# Patient Record
Sex: Female | Born: 1950
Health system: Southern US, Community
[De-identification: ages and names within clinical notes are randomized; demographics above are authoritative.]

## PROBLEM LIST (undated history)

## (undated) DIAGNOSIS — G4730 Sleep apnea, unspecified: Secondary | ICD-10-CM

## (undated) DIAGNOSIS — R112 Nausea with vomiting, unspecified: Secondary | ICD-10-CM

## (undated) DIAGNOSIS — F329 Major depressive disorder, single episode, unspecified: Secondary | ICD-10-CM

## (undated) DIAGNOSIS — Q249 Congenital malformation of heart, unspecified: Secondary | ICD-10-CM

## (undated) DIAGNOSIS — F32A Depression, unspecified: Secondary | ICD-10-CM

## (undated) DIAGNOSIS — I10 Essential (primary) hypertension: Secondary | ICD-10-CM

## (undated) DIAGNOSIS — N189 Chronic kidney disease, unspecified: Secondary | ICD-10-CM

## (undated) DIAGNOSIS — M1990 Unspecified osteoarthritis, unspecified site: Secondary | ICD-10-CM

## (undated) DIAGNOSIS — Z9889 Other specified postprocedural states: Secondary | ICD-10-CM

## (undated) DIAGNOSIS — E785 Hyperlipidemia, unspecified: Secondary | ICD-10-CM

## (undated) HISTORY — PX: TUMOR REMOVAL: SHX12

## (undated) HISTORY — DX: Congenital malformation of heart, unspecified: Q24.9

## (undated) HISTORY — DX: Chronic kidney disease, unspecified: N18.9

## (undated) HISTORY — DX: Other specified postprocedural states: Z98.890

## (undated) HISTORY — DX: Nausea with vomiting, unspecified: R11.2

## (undated) HISTORY — DX: Sleep apnea, unspecified: G47.30

## (undated) HISTORY — PX: KNEE ARTHROSCOPY: SUR90

## (undated) HISTORY — DX: Depression, unspecified: F32.A

## (undated) HISTORY — PX: BREAST REDUCTION SURGERY: SHX8

---
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
## 1997-01-18 HISTORY — PX: ABDOMINAL HYSTERECTOMY: SHX81

## 2004-03-27 ENCOUNTER — Encounter: Admission: RE | Admit: 2004-03-27 | Discharge: 2004-03-27 | Payer: Self-pay | Admitting: Unknown Physician Specialty

## 2004-06-19 ENCOUNTER — Encounter: Admission: RE | Admit: 2004-06-19 | Discharge: 2004-06-19 | Payer: Self-pay | Admitting: Unknown Physician Specialty

## 2004-06-19 ENCOUNTER — Encounter (INDEPENDENT_AMBULATORY_CARE_PROVIDER_SITE_OTHER): Payer: Self-pay | Admitting: *Deleted

## 2005-08-17 ENCOUNTER — Encounter: Admission: RE | Admit: 2005-08-17 | Discharge: 2005-08-17 | Payer: Self-pay | Admitting: Family Medicine

## 2006-09-08 ENCOUNTER — Encounter: Admission: RE | Admit: 2006-09-08 | Discharge: 2006-09-08 | Payer: Self-pay | Admitting: Family Medicine

## 2007-09-11 ENCOUNTER — Encounter: Admission: RE | Admit: 2007-09-11 | Discharge: 2007-09-11 | Payer: Self-pay | Admitting: Family Medicine

## 2008-09-16 ENCOUNTER — Encounter: Admission: RE | Admit: 2008-09-16 | Discharge: 2008-09-16 | Payer: Self-pay | Admitting: Family Medicine

## 2008-11-07 ENCOUNTER — Ambulatory Visit (HOSPITAL_BASED_OUTPATIENT_CLINIC_OR_DEPARTMENT_OTHER): Admission: RE | Admit: 2008-11-07 | Discharge: 2008-11-07 | Payer: Self-pay | Admitting: Plastic Surgery

## 2008-11-07 ENCOUNTER — Encounter (INDEPENDENT_AMBULATORY_CARE_PROVIDER_SITE_OTHER): Payer: Self-pay | Admitting: Plastic Surgery

## 2010-04-23 LAB — POCT HEMOGLOBIN-HEMACUE: Hemoglobin: 14.2 g/dL (ref 12.0–15.0)

## 2010-04-23 LAB — POCT I-STAT, CHEM 8
BUN: 16 mg/dL (ref 6–23)
Calcium, Ion: 1.12 mmol/L (ref 1.12–1.32)
Creatinine, Ser: 0.8 mg/dL (ref 0.4–1.2)
Glucose, Bld: 98 mg/dL (ref 70–99)
Hemoglobin: 14.3 g/dL (ref 12.0–15.0)
Sodium: 142 mEq/L (ref 135–145)
TCO2: 28 mmol/L (ref 0–100)

## 2010-04-23 LAB — BASIC METABOLIC PANEL
BUN: 15 mg/dL (ref 6–23)
Calcium: 9.4 mg/dL (ref 8.4–10.5)
Chloride: 105 mEq/L (ref 96–112)
GFR calc non Af Amer: >60 mL/min (ref >60)
Sodium: 141 mEq/L (ref 135–145)

## 2016-08-26 DIAGNOSIS — R7303 Prediabetes: Secondary | ICD-10-CM | POA: Diagnosis not present

## 2016-08-26 DIAGNOSIS — E782 Mixed hyperlipidemia: Secondary | ICD-10-CM | POA: Diagnosis not present

## 2016-08-26 DIAGNOSIS — I129 Hypertensive chronic kidney disease with stage 1 through stage 4 chronic kidney disease, or unspecified chronic kidney disease: Secondary | ICD-10-CM | POA: Diagnosis not present

## 2016-09-02 DIAGNOSIS — H524 Presbyopia: Secondary | ICD-10-CM | POA: Diagnosis not present

## 2016-09-02 DIAGNOSIS — Z139 Encounter for screening, unspecified: Secondary | ICD-10-CM | POA: Diagnosis not present

## 2016-09-02 DIAGNOSIS — N182 Chronic kidney disease, stage 2 (mild): Secondary | ICD-10-CM | POA: Diagnosis not present

## 2016-09-02 DIAGNOSIS — I129 Hypertensive chronic kidney disease with stage 1 through stage 4 chronic kidney disease, or unspecified chronic kidney disease: Secondary | ICD-10-CM | POA: Diagnosis not present

## 2016-09-02 DIAGNOSIS — H25013 Cortical age-related cataract, bilateral: Secondary | ICD-10-CM | POA: Diagnosis not present

## 2016-09-02 DIAGNOSIS — Z6833 Body mass index (BMI) 33.0-33.9, adult: Secondary | ICD-10-CM | POA: Diagnosis not present

## 2016-09-02 DIAGNOSIS — E782 Mixed hyperlipidemia: Secondary | ICD-10-CM | POA: Diagnosis not present

## 2016-09-08 DIAGNOSIS — G4733 Obstructive sleep apnea (adult) (pediatric): Secondary | ICD-10-CM | POA: Diagnosis not present

## 2016-09-08 DIAGNOSIS — R0902 Hypoxemia: Secondary | ICD-10-CM | POA: Diagnosis not present

## 2016-09-08 DIAGNOSIS — R5383 Other fatigue: Secondary | ICD-10-CM | POA: Diagnosis not present

## 2016-10-15 DIAGNOSIS — G4733 Obstructive sleep apnea (adult) (pediatric): Secondary | ICD-10-CM | POA: Diagnosis not present

## 2016-11-01 DIAGNOSIS — J0100 Acute maxillary sinusitis, unspecified: Secondary | ICD-10-CM | POA: Diagnosis not present

## 2016-11-14 DIAGNOSIS — G4733 Obstructive sleep apnea (adult) (pediatric): Secondary | ICD-10-CM | POA: Diagnosis not present

## 2016-11-23 DIAGNOSIS — G4733 Obstructive sleep apnea (adult) (pediatric): Secondary | ICD-10-CM | POA: Diagnosis not present

## 2016-12-14 DIAGNOSIS — M9903 Segmental and somatic dysfunction of lumbar region: Secondary | ICD-10-CM | POA: Diagnosis not present

## 2016-12-14 DIAGNOSIS — M62830 Muscle spasm of back: Secondary | ICD-10-CM | POA: Diagnosis not present

## 2016-12-14 DIAGNOSIS — M5416 Radiculopathy, lumbar region: Secondary | ICD-10-CM | POA: Diagnosis not present

## 2016-12-14 DIAGNOSIS — M9901 Segmental and somatic dysfunction of cervical region: Secondary | ICD-10-CM | POA: Diagnosis not present

## 2016-12-14 DIAGNOSIS — M9902 Segmental and somatic dysfunction of thoracic region: Secondary | ICD-10-CM | POA: Diagnosis not present

## 2016-12-14 DIAGNOSIS — M9905 Segmental and somatic dysfunction of pelvic region: Secondary | ICD-10-CM | POA: Diagnosis not present

## 2016-12-14 DIAGNOSIS — M542 Cervicalgia: Secondary | ICD-10-CM | POA: Diagnosis not present

## 2016-12-15 DIAGNOSIS — G4733 Obstructive sleep apnea (adult) (pediatric): Secondary | ICD-10-CM | POA: Diagnosis not present

## 2016-12-16 DIAGNOSIS — M9905 Segmental and somatic dysfunction of pelvic region: Secondary | ICD-10-CM | POA: Diagnosis not present

## 2016-12-16 DIAGNOSIS — M9901 Segmental and somatic dysfunction of cervical region: Secondary | ICD-10-CM | POA: Diagnosis not present

## 2016-12-16 DIAGNOSIS — M62830 Muscle spasm of back: Secondary | ICD-10-CM | POA: Diagnosis not present

## 2016-12-16 DIAGNOSIS — M5416 Radiculopathy, lumbar region: Secondary | ICD-10-CM | POA: Diagnosis not present

## 2016-12-16 DIAGNOSIS — M542 Cervicalgia: Secondary | ICD-10-CM | POA: Diagnosis not present

## 2016-12-16 DIAGNOSIS — M9902 Segmental and somatic dysfunction of thoracic region: Secondary | ICD-10-CM | POA: Diagnosis not present

## 2016-12-16 DIAGNOSIS — M9903 Segmental and somatic dysfunction of lumbar region: Secondary | ICD-10-CM | POA: Diagnosis not present

## 2016-12-21 DIAGNOSIS — M9901 Segmental and somatic dysfunction of cervical region: Secondary | ICD-10-CM | POA: Diagnosis not present

## 2016-12-21 DIAGNOSIS — M62830 Muscle spasm of back: Secondary | ICD-10-CM | POA: Diagnosis not present

## 2016-12-21 DIAGNOSIS — M542 Cervicalgia: Secondary | ICD-10-CM | POA: Diagnosis not present

## 2016-12-21 DIAGNOSIS — M9905 Segmental and somatic dysfunction of pelvic region: Secondary | ICD-10-CM | POA: Diagnosis not present

## 2016-12-21 DIAGNOSIS — M9903 Segmental and somatic dysfunction of lumbar region: Secondary | ICD-10-CM | POA: Diagnosis not present

## 2016-12-21 DIAGNOSIS — M9902 Segmental and somatic dysfunction of thoracic region: Secondary | ICD-10-CM | POA: Diagnosis not present

## 2016-12-21 DIAGNOSIS — M5416 Radiculopathy, lumbar region: Secondary | ICD-10-CM | POA: Diagnosis not present

## 2016-12-23 DIAGNOSIS — M5416 Radiculopathy, lumbar region: Secondary | ICD-10-CM | POA: Diagnosis not present

## 2016-12-23 DIAGNOSIS — M542 Cervicalgia: Secondary | ICD-10-CM | POA: Diagnosis not present

## 2016-12-23 DIAGNOSIS — M62830 Muscle spasm of back: Secondary | ICD-10-CM | POA: Diagnosis not present

## 2016-12-23 DIAGNOSIS — M9903 Segmental and somatic dysfunction of lumbar region: Secondary | ICD-10-CM | POA: Diagnosis not present

## 2016-12-23 DIAGNOSIS — M9901 Segmental and somatic dysfunction of cervical region: Secondary | ICD-10-CM | POA: Diagnosis not present

## 2016-12-23 DIAGNOSIS — M9902 Segmental and somatic dysfunction of thoracic region: Secondary | ICD-10-CM | POA: Diagnosis not present

## 2016-12-23 DIAGNOSIS — M9905 Segmental and somatic dysfunction of pelvic region: Secondary | ICD-10-CM | POA: Diagnosis not present

## 2016-12-24 DIAGNOSIS — I129 Hypertensive chronic kidney disease with stage 1 through stage 4 chronic kidney disease, or unspecified chronic kidney disease: Secondary | ICD-10-CM | POA: Diagnosis not present

## 2016-12-24 DIAGNOSIS — R7303 Prediabetes: Secondary | ICD-10-CM | POA: Diagnosis not present

## 2016-12-24 DIAGNOSIS — E782 Mixed hyperlipidemia: Secondary | ICD-10-CM | POA: Diagnosis not present

## 2016-12-28 DIAGNOSIS — M62830 Muscle spasm of back: Secondary | ICD-10-CM | POA: Diagnosis not present

## 2016-12-28 DIAGNOSIS — M9902 Segmental and somatic dysfunction of thoracic region: Secondary | ICD-10-CM | POA: Diagnosis not present

## 2016-12-28 DIAGNOSIS — M9903 Segmental and somatic dysfunction of lumbar region: Secondary | ICD-10-CM | POA: Diagnosis not present

## 2016-12-28 DIAGNOSIS — M9901 Segmental and somatic dysfunction of cervical region: Secondary | ICD-10-CM | POA: Diagnosis not present

## 2016-12-28 DIAGNOSIS — M542 Cervicalgia: Secondary | ICD-10-CM | POA: Diagnosis not present

## 2016-12-28 DIAGNOSIS — M5416 Radiculopathy, lumbar region: Secondary | ICD-10-CM | POA: Diagnosis not present

## 2016-12-28 DIAGNOSIS — M9905 Segmental and somatic dysfunction of pelvic region: Secondary | ICD-10-CM | POA: Diagnosis not present

## 2017-01-04 DIAGNOSIS — M5416 Radiculopathy, lumbar region: Secondary | ICD-10-CM | POA: Diagnosis not present

## 2017-01-04 DIAGNOSIS — M9902 Segmental and somatic dysfunction of thoracic region: Secondary | ICD-10-CM | POA: Diagnosis not present

## 2017-01-04 DIAGNOSIS — M9905 Segmental and somatic dysfunction of pelvic region: Secondary | ICD-10-CM | POA: Diagnosis not present

## 2017-01-04 DIAGNOSIS — M9901 Segmental and somatic dysfunction of cervical region: Secondary | ICD-10-CM | POA: Diagnosis not present

## 2017-01-04 DIAGNOSIS — M9903 Segmental and somatic dysfunction of lumbar region: Secondary | ICD-10-CM | POA: Diagnosis not present

## 2017-01-04 DIAGNOSIS — M62830 Muscle spasm of back: Secondary | ICD-10-CM | POA: Diagnosis not present

## 2017-01-04 DIAGNOSIS — M542 Cervicalgia: Secondary | ICD-10-CM | POA: Diagnosis not present

## 2017-01-14 DIAGNOSIS — G4733 Obstructive sleep apnea (adult) (pediatric): Secondary | ICD-10-CM | POA: Diagnosis not present

## 2017-01-24 DIAGNOSIS — Z780 Asymptomatic menopausal state: Secondary | ICD-10-CM | POA: Diagnosis not present

## 2017-01-24 DIAGNOSIS — Z13820 Encounter for screening for osteoporosis: Secondary | ICD-10-CM | POA: Diagnosis not present

## 2017-02-14 DIAGNOSIS — G4733 Obstructive sleep apnea (adult) (pediatric): Secondary | ICD-10-CM | POA: Diagnosis not present

## 2017-02-21 DIAGNOSIS — E669 Obesity, unspecified: Secondary | ICD-10-CM | POA: Diagnosis not present

## 2017-02-28 DIAGNOSIS — E669 Obesity, unspecified: Secondary | ICD-10-CM | POA: Diagnosis not present

## 2017-03-07 DIAGNOSIS — Z6835 Body mass index (BMI) 35.0-35.9, adult: Secondary | ICD-10-CM | POA: Diagnosis not present

## 2017-03-07 DIAGNOSIS — E669 Obesity, unspecified: Secondary | ICD-10-CM | POA: Diagnosis not present

## 2017-03-14 DIAGNOSIS — Z6835 Body mass index (BMI) 35.0-35.9, adult: Secondary | ICD-10-CM | POA: Diagnosis not present

## 2017-03-17 DIAGNOSIS — G4733 Obstructive sleep apnea (adult) (pediatric): Secondary | ICD-10-CM | POA: Diagnosis not present

## 2017-03-28 DIAGNOSIS — Z6835 Body mass index (BMI) 35.0-35.9, adult: Secondary | ICD-10-CM | POA: Diagnosis not present

## 2017-04-07 DIAGNOSIS — E782 Mixed hyperlipidemia: Secondary | ICD-10-CM | POA: Diagnosis not present

## 2017-04-07 DIAGNOSIS — R7303 Prediabetes: Secondary | ICD-10-CM | POA: Diagnosis not present

## 2017-04-07 DIAGNOSIS — I129 Hypertensive chronic kidney disease with stage 1 through stage 4 chronic kidney disease, or unspecified chronic kidney disease: Secondary | ICD-10-CM | POA: Diagnosis not present

## 2017-04-11 DIAGNOSIS — Z6835 Body mass index (BMI) 35.0-35.9, adult: Secondary | ICD-10-CM | POA: Diagnosis not present

## 2017-04-14 DIAGNOSIS — R7303 Prediabetes: Secondary | ICD-10-CM | POA: Diagnosis not present

## 2017-04-14 DIAGNOSIS — Z1331 Encounter for screening for depression: Secondary | ICD-10-CM | POA: Diagnosis not present

## 2017-04-14 DIAGNOSIS — Z9181 History of falling: Secondary | ICD-10-CM | POA: Diagnosis not present

## 2017-04-14 DIAGNOSIS — G4733 Obstructive sleep apnea (adult) (pediatric): Secondary | ICD-10-CM | POA: Diagnosis not present

## 2017-04-14 DIAGNOSIS — I129 Hypertensive chronic kidney disease with stage 1 through stage 4 chronic kidney disease, or unspecified chronic kidney disease: Secondary | ICD-10-CM | POA: Diagnosis not present

## 2017-04-14 DIAGNOSIS — E782 Mixed hyperlipidemia: Secondary | ICD-10-CM | POA: Diagnosis not present

## 2017-04-14 DIAGNOSIS — Z0000 Encounter for general adult medical examination without abnormal findings: Secondary | ICD-10-CM | POA: Diagnosis not present

## 2017-04-14 DIAGNOSIS — N182 Chronic kidney disease, stage 2 (mild): Secondary | ICD-10-CM | POA: Diagnosis not present

## 2017-04-14 DIAGNOSIS — Z139 Encounter for screening, unspecified: Secondary | ICD-10-CM | POA: Diagnosis not present

## 2017-04-18 DIAGNOSIS — Z7189 Other specified counseling: Secondary | ICD-10-CM | POA: Diagnosis not present

## 2017-04-18 DIAGNOSIS — R7303 Prediabetes: Secondary | ICD-10-CM | POA: Diagnosis not present

## 2017-04-18 DIAGNOSIS — Z6834 Body mass index (BMI) 34.0-34.9, adult: Secondary | ICD-10-CM | POA: Diagnosis not present

## 2017-05-02 DIAGNOSIS — E782 Mixed hyperlipidemia: Secondary | ICD-10-CM | POA: Diagnosis not present

## 2017-05-02 DIAGNOSIS — R7303 Prediabetes: Secondary | ICD-10-CM | POA: Diagnosis not present

## 2017-05-02 DIAGNOSIS — Z6834 Body mass index (BMI) 34.0-34.9, adult: Secondary | ICD-10-CM | POA: Diagnosis not present

## 2017-05-15 DIAGNOSIS — G4733 Obstructive sleep apnea (adult) (pediatric): Secondary | ICD-10-CM | POA: Diagnosis not present

## 2017-05-16 DIAGNOSIS — R7303 Prediabetes: Secondary | ICD-10-CM | POA: Diagnosis not present

## 2017-05-16 DIAGNOSIS — Z6834 Body mass index (BMI) 34.0-34.9, adult: Secondary | ICD-10-CM | POA: Diagnosis not present

## 2017-05-16 DIAGNOSIS — E782 Mixed hyperlipidemia: Secondary | ICD-10-CM | POA: Diagnosis not present

## 2017-05-30 DIAGNOSIS — Z6834 Body mass index (BMI) 34.0-34.9, adult: Secondary | ICD-10-CM | POA: Diagnosis not present

## 2017-05-30 DIAGNOSIS — R7303 Prediabetes: Secondary | ICD-10-CM | POA: Diagnosis not present

## 2017-05-30 DIAGNOSIS — E782 Mixed hyperlipidemia: Secondary | ICD-10-CM | POA: Diagnosis not present

## 2017-06-14 DIAGNOSIS — G4733 Obstructive sleep apnea (adult) (pediatric): Secondary | ICD-10-CM | POA: Diagnosis not present

## 2017-06-27 DIAGNOSIS — E782 Mixed hyperlipidemia: Secondary | ICD-10-CM | POA: Diagnosis not present

## 2017-06-27 DIAGNOSIS — R7303 Prediabetes: Secondary | ICD-10-CM | POA: Diagnosis not present

## 2017-06-27 DIAGNOSIS — Z6834 Body mass index (BMI) 34.0-34.9, adult: Secondary | ICD-10-CM | POA: Diagnosis not present

## 2017-07-11 DIAGNOSIS — R7303 Prediabetes: Secondary | ICD-10-CM | POA: Diagnosis not present

## 2017-07-11 DIAGNOSIS — E782 Mixed hyperlipidemia: Secondary | ICD-10-CM | POA: Diagnosis not present

## 2017-07-11 DIAGNOSIS — Z6834 Body mass index (BMI) 34.0-34.9, adult: Secondary | ICD-10-CM | POA: Diagnosis not present

## 2017-07-15 DIAGNOSIS — G4733 Obstructive sleep apnea (adult) (pediatric): Secondary | ICD-10-CM | POA: Diagnosis not present

## 2017-07-29 ENCOUNTER — Other Ambulatory Visit (HOSPITAL_COMMUNITY): Payer: PPO

## 2017-07-29 ENCOUNTER — Encounter (HOSPITAL_COMMUNITY): Payer: Self-pay | Admitting: *Deleted

## 2017-07-29 ENCOUNTER — Observation Stay (HOSPITAL_COMMUNITY)
Admission: EM | Admit: 2017-07-29 | Discharge: 2017-07-30 | Disposition: A | Discharge status: Home / Self Care | Payer: PPO | Attending: Internal Medicine | Admitting: Internal Medicine

## 2017-07-29 ENCOUNTER — Emergency Department (HOSPITAL_COMMUNITY): Payer: PPO

## 2017-07-29 ENCOUNTER — Other Ambulatory Visit: Payer: Self-pay

## 2017-07-29 ENCOUNTER — Observation Stay (HOSPITAL_COMMUNITY): Payer: PPO

## 2017-07-29 DIAGNOSIS — I10 Essential (primary) hypertension: Secondary | ICD-10-CM | POA: Diagnosis not present

## 2017-07-29 DIAGNOSIS — R55 Syncope and collapse: Secondary | ICD-10-CM | POA: Diagnosis not present

## 2017-07-29 DIAGNOSIS — R079 Chest pain, unspecified: Principal | ICD-10-CM | POA: Insufficient documentation

## 2017-07-29 DIAGNOSIS — R0789 Other chest pain: Secondary | ICD-10-CM | POA: Diagnosis not present

## 2017-07-29 HISTORY — DX: Hyperlipidemia, unspecified: E78.5

## 2017-07-29 HISTORY — DX: Chest pain, unspecified: R07.9

## 2017-07-29 HISTORY — DX: Unspecified osteoarthritis, unspecified site: M19.90

## 2017-07-29 HISTORY — DX: Essential (primary) hypertension: I10

## 2017-07-29 LAB — CBC
HCT: 47.8 % — ABNORMAL HIGH (ref 36.0–46.0)
HEMOGLOBIN: 15.2 g/dL — AB (ref 12.0–15.0)
MCH: 28.8 pg (ref 26.0–34.0)
MCHC: 31.8 g/dL (ref 30.0–36.0)
MCV: 90.5 fL (ref 78.0–100.0)
Platelets: 216 10*3/uL (ref 150–400)
RBC: 5.28 MIL/uL — ABNORMAL HIGH (ref 3.87–5.11)
RDW: 12.5 % (ref 11.5–15.5)
WBC: 6.9 10*3/uL (ref 4.0–10.5)

## 2017-07-29 LAB — TROPONIN I: Troponin I: <0.03 ng/mL (ref <0.03)

## 2017-07-29 LAB — BASIC METABOLIC PANEL
ANION GAP: 10 (ref 5–15)
BUN: 21 mg/dL (ref 8–23)
CALCIUM: 9.2 mg/dL (ref 8.9–10.3)
CO2: 25 mmol/L (ref 22–32)
Chloride: 106 mmol/L (ref 98–111)
Creatinine, Ser: 0.93 mg/dL (ref 0.44–1.00)
GFR calc non Af Amer: >60 mL/min (ref >60)
GLUCOSE: 117 mg/dL — AB (ref 70–99)
Potassium: 4.4 mmol/L (ref 3.5–5.1)
Sodium: 141 mmol/L (ref 135–145)

## 2017-07-29 LAB — HEMOGLOBIN A1C
Hgb A1c MFr Bld: 5.4 % (ref 4.8–5.6)
MEAN PLASMA GLUCOSE: 108.28 mg/dL

## 2017-07-29 LAB — I-STAT TROPONIN, ED: TROPONIN I, POC: 0 ng/mL (ref 0.00–0.08)

## 2017-07-29 LAB — TSH: TSH: 1.021 u[IU]/mL (ref 0.350–4.500)

## 2017-07-29 MED ORDER — SODIUM CHLORIDE 0.9 % IV BOLUS
1000.0000 mL | Freq: Once | INTRAVENOUS | Status: AC
  Administered 2017-07-29: 1000 mL via INTRAVENOUS

## 2017-07-29 MED ORDER — ACETAMINOPHEN 650 MG RE SUPP: 650.0000 mg | Freq: Four times a day (QID) | RECTAL | Status: DC | PRN

## 2017-07-29 MED ORDER — ACETAMINOPHEN 325 MG PO TABS
650.0000 mg | ORAL_TABLET | Freq: Four times a day (QID) | ORAL | Status: DC | PRN
  Administered 2017-07-29: 650 mg via ORAL
  Filled 2017-07-29: qty 2

## 2017-07-29 MED ORDER — CITALOPRAM HYDROBROMIDE 20 MG PO TABS
20.0000 mg | ORAL_TABLET | Freq: Every day | ORAL | Status: DC
  Administered 2017-07-29: 20 mg via ORAL
  Filled 2017-07-29: qty 1

## 2017-07-29 MED ORDER — IBUPROFEN 200 MG PO TABS
400.0000 mg | ORAL_TABLET | Freq: Once | ORAL | Status: AC | PRN
  Administered 2017-07-29: 400 mg via ORAL
  Filled 2017-07-29: qty 2

## 2017-07-29 MED ORDER — ENOXAPARIN SODIUM 40 MG/0.4ML ~~LOC~~ SOLN
40.0000 mg | SUBCUTANEOUS | Status: DC
  Administered 2017-07-29: 40 mg via SUBCUTANEOUS
  Filled 2017-07-29: qty 0.4

## 2017-07-29 MED ORDER — ASPIRIN 81 MG PO CHEW
324.0000 mg | CHEWABLE_TABLET | Freq: Once | ORAL | Status: AC
  Administered 2017-07-29: 324 mg via ORAL
  Filled 2017-07-29: qty 4

## 2017-07-29 MED ORDER — ATORVASTATIN CALCIUM 20 MG PO TABS
20.0000 mg | ORAL_TABLET | Freq: Every day | ORAL | Status: DC
  Administered 2017-07-29: 20 mg via ORAL
  Filled 2017-07-29: qty 1

## 2017-07-29 NOTE — ED Triage Notes (Signed)
Pt in stating she started to feel lightheaded and her vision went black, pt did not pass out, then developed tightness in her chest, no history of same, reports shortness of breath and diaphoresis as well, no distress noted

## 2017-07-29 NOTE — ED Provider Notes (Signed)
[facility]ENC[facility] DEPARTMENT Provider Note   CSN: [id number] Arrival date & time: 07/29/17  1249     History   Chief Complaint Chief Complaint  Patient presents with  . Near Syncope  . Chest Pain    HPI Kristen Horne is a 67 y.o. female.  HPI Kristen Horne is a 67 y.o. female with history of hypertension, hyperlipidemia, presents to emergency department complaining of near syncopal episode and chest tightness.  Patient states she walked into the store, while at the store she suddenly started feeling dizzy, states her vision started going black, and she developed some tightness in her chest.  She told her husband that she is going to faint, and they help to lower her to the ground.  She states that although she was very close to fainting, she never lost consciousness.  She states that she went to urgent care, while there she was found to be dizzy upon standing, however no definite orthostatic vital changes were seen.  She was still having chest tightness so she was sent here.  Patient denies any prior cardiac problems.  She has never had a stress test or seen by cardiology.  Past Medical History:  Diagnosis Date  . Arthritis   . Hyperlipemia   . Hypertension     There are no active problems to display for this patient.   History reviewed. No pertinent surgical history.   OB History   None      Home Medications    Prior to Admission medications   Not on File    Family History History reviewed. No pertinent family history.  Social History Social History   Tobacco Use  . Smoking status: Never Smoker  . Smokeless tobacco: Never Used  Substance Use Topics  . Alcohol use: Not on file  . Drug use: Not on file     Allergies   Patient has no known allergies.   Review of Systems Review of Systems  Constitutional: Negative for chills and fever.  Respiratory: Negative for cough, chest tightness and shortness of breath.   Cardiovascular:  Positive for chest pain. Negative for palpitations and leg swelling.  Gastrointestinal: Negative for abdominal pain, diarrhea, nausea and vomiting.  Genitourinary: Negative for dysuria, flank pain and pelvic pain.  Musculoskeletal: Negative for arthralgias, myalgias, neck pain and neck stiffness.  Skin: Negative for rash.  Neurological: Positive for dizziness and light-headedness. Negative for weakness and headaches.  All other systems reviewed and are negative.    Physical Exam Updated Vital Signs BP (!) 180/88 (BP Location: Right Arm)   Pulse 61   Temp 98 F (36.7 C) (Oral)   Resp 20   SpO2 100%   Physical Exam  Constitutional: She is oriented to person, place, and time. She appears well-developed and well-nourished. No distress.  HENT:  Head: Normocephalic.  Eyes: Conjunctivae are normal.  Neck: Neck supple.  Cardiovascular: Normal rate, regular rhythm and normal heart sounds.  Pulmonary/Chest: Effort normal and breath sounds normal. No respiratory distress. She has no wheezes. She has no rales.  Abdominal: Soft. Bowel sounds are normal. She exhibits no distension. There is no tenderness. There is no rebound.  Musculoskeletal: She exhibits no edema.  Neurological: She is alert and oriented to person, place, and time.  Skin: Skin is warm and dry.  Psychiatric: She has a normal mood and affect. Her behavior is normal.  Nursing note and vitals reviewed.    ED Treatments / Results  Labs (  all labs ordered are listed, but only abnormal results are displayed) Labs Reviewed  BASIC METABOLIC PANEL - Abnormal; Notable for the following components:      Result Value   Glucose, Bld 117 (*)    All other components within normal limits  CBC - Abnormal; Notable for the following components:   RBC 5.28 (*)    Hemoglobin 15.2 (*)    HCT 47.8 (*)    All other components within normal limits  TROPONIN I  TROPONIN I  TSH  HEMOGLOBIN A1C  HIV ANTIBODY (ROUTINE TESTING)  LIPID  PANEL  TROPONIN I  TROPONIN I  I-STAT TROPONIN, ED    EKG EKG Interpretation  Date/Time:  Friday July 29 2017 12:51:55 EDT Ventricular Rate:  57 PR Interval:  152 QRS Duration: 70 QT Interval:  470 QTC Calculation: 457 R Axis:   2 Text Interpretation:  Sinus bradycardia Low voltage QRS Cannot rule out Anterior infarct , age undetermined Abnormal ECG amplitude diminished Otherwise no significant change Confirmed by Deno Etienne [phone]) on 07/29/2017 1:01:13 PM   Radiology Dg Chest 2 View  Result Date: 07/29/2017 CLINICAL DATA:  Chest pain EXAM: CHEST - 2 VIEW COMPARISON:  None. FINDINGS: There is minimal scarring in the left base. Lungs elsewhere are clear. Heart is mildly enlarged with pulmonary vascularity normal. No adenopathy. No pneumothorax. No bone lesions. IMPRESSION: Slight scarring left base. No edema or consolidation. Heart mildly enlarged. Electronically Signed   By: Lowella Grip III M.D.   On: 07/29/2017 13:21    Procedures Procedures (including critical care time)  Medications Ordered in ED Medications - No data to display   Initial Impression / Assessment and Plan / ED Course  I have reviewed the triage vital signs and the nursing notes.  Pertinent labs & imaging results that were available during my care of the patient were reviewed by me and considered in my medical decision making (see chart for details).     Patient in emergency department with near syncopal episode and chest pressure.  No prior cardiac problems.  Patient is hypertensive, not tachycardic, tachypneic, hypoxic.  No recent travel or surgeries.  No lower extremity pain.  No concern about PE.  Symptoms slightly concerning for ACS, will check troponin.  Patient however has not had any recent exertional symptoms.  She does have a family history of heart disease in her father and A. fib in her mother.   First troponin is negative.  Chest x-ray and rest of the blood work unremarkable.  Patient  does have some risk factors for coronary artery disease, including obesity, hypertension, hyperlipidemia.  Her heart score is 4.  She has never had any prior cardiac work-up.  I will admit her for further evaluation.  It is possible that patient may have had arrhythmia that caused her to have near syncopal episode, she does state that she felt like her heart was racing as well.   spoke with teaching service, will admit.   Vitals:   07/29/17 1254 07/29/17 1400 07/29/17 1448 07/29/17 1550  BP: (!) 180/88 (!) 168/90  (!) 163/92  Pulse: 61 (!) 57  (!) 59  Resp: 20 17  18   Temp: 98 F (36.7 C)  98 F (36.7 C) (!) 97.4 F (36.3 C)  TempSrc: Oral   Oral  SpO2: 100% 97%  100%  Weight:    92.2 kg (203 lb 4.2 oz)  Height:    5\' 5"  (1.651 m)    Final Clinical Impressions(s) /  ED Diagnoses   Final diagnoses:  Near syncope  Chest pain, unspecified type    ED Discharge Orders    None       Jeannett Senior, PA-C 07/29/17 2015    Deno Etienne, DO 07/29/17 2101

## 2017-07-29 NOTE — Plan of Care (Signed)

## 2017-07-29 NOTE — ED Notes (Signed)
Patient ambulatory to bathroom with steady gait at this time 

## 2017-07-29 NOTE — H&P (Addendum)
Date: 07/29/2017               Patient Name:  Kristen Horne MRN: [id number]  DOB: December 30, 1950 Age / Sex: 67 y.o., female   PCP: Marco Collie, MD              Medical Service: Internal Medicine Teaching Service              Attending Physician: Dr. Bartholomew Crews, MD    First Contact: Rhetta Mura, MS Pager: [phone]  Second Contact: Dr. Jari Favre  Pager: [phone]            After Hours (After 5p/  First Contact Pager: [phone]  weekends / holidays): Second Contact Pager: [phone]   Chief Complaint: Near Syncope with chest pain   History of Present Illness: Kristen Horne is a 65 yoF with a PMH of HLD and Htn, who presents after a near syncopal episode during and after which she experience a band-like chest pressure and dizziness. She described the episode as occurring as she was walking into a flea market. She awoke today in her normal state of health, had just eaten breakfast and been walking in the market for <5 mins when she began to feal light-headed and "just needed to sit down." She stated that her peripheral vision began to go dark and she became dizzy like she was going to pass out and diaphoretic. She denies any LOC, falling or any mechanical trauma associated with this incident. She endorses chest pressure "like someone filling my chest up with air" that radiated to her left shoulder and feeling short of breath while this was ongoing. The previous Sunday she noticed a brief, sharp, pain in her chest, which occurred while she was in the kitchen moving about. She denies any history of similar chest pain or pressure beyond these two incidents and has never experienced such symptoms in the past when walking longer distances or climbing flights of stairs. She also endorses feeling nauseated during this event and having an unusually loose bowel movement (not loss of fecal continence) shortly after the initial event. She denies any blood in her stool or black stools, changes in urination, GERD  or heartburn symptoms, changes in vision, feeling weak or tired recently, increased SOB, palpitations, difficulties walking or with balance, difficulty swallowing or abdominal pain. She does state that she is usually a bit constipated and does endorse a headache now.  After the initial episode, the patient went to an urgent care in [place] and was told to come to [facility] without any intervention. At [facility] in the ED she was given 325 mg Aspirin, had an EKG that was significant for sinus bradycardia and did not reveal any ST elevations or signs of acute ischemia, had negative troponin x1, and had a chest x-ray that did not reveal any acute cardiopulmonary pathology. She states that the chest pain abated after a few hours while she was in the [facility] ED. She was not given Nitroglycerine before this point. Of note, the patient states that her father had his first heart attack at 38, had a coronary bipass by her age and had a PE and her mother has a history of Afib and had a DVT, which reportedly occurred in the setting of surgery for varicose veins. The patient denies any recent travel or prolonged immobilization, cough, fever or sick contacts. The patient has a personal history of passing out when her dad was in the heart unit and she was  under a lot of stress.        Meds: Current Meds  Medication Sig  . atorvastatin (LIPITOR) 20 MG tablet Take 20 mg by mouth at bedtime.   . citalopram (CELEXA) 40 MG tablet Take 40 mg by mouth at bedtime.   . meloxicam (MOBIC) 15 MG tablet Take 15 mg by mouth at bedtime.   . metoprolol succinate (TOPROL-XL) 25 MG 24 hr tablet Take 25 mg by mouth at bedtime.   Allergies: Allergies as of 07/29/2017  . (No Known Allergies)   Past Medical History:  Diagnosis Date  . Arthritis   . Hyperlipemia   . Hypertension     Family History: Father: heart attack at 67, Triple Bipass 13, PE   Mother: DVT in setting of varicose vein surgery, Afib,  heart problems   Social  History: The patient is a retired Freight forwarder for a [employer]. She is accompanied today by her husband who is attentive and present during the interview and drove her to the hospital. She has never used alcohol, tobacco or other drugs.   Review of Systems: A complete ROS was negative except as per HPI.   Physical Exam: Blood pressure (!) 163/92, pulse (!) 59, temperature (!) 97.4 F (36.3 C), temperature source Oral, resp. rate 18, height 5\' 5"  (1.651 m), weight 92.2 kg (203 lb 4.2 oz), SpO2 100 %. General: alert and oriented, in no acute distress HEENT: no carotid bruits Pulm: No increased work of breathing, lungs CTAB  CV: RRR, nL S1 with loud S2 best appreciated over Upper Left Sternal Boarder or mitral area, no murmurs appreciate, no carotid bruits appreciated Abd: BS+, non-distended  Ext: Radial and dorsalis pedis pulses symmetric, no peripheral edema, no tenderness to palpation of calves, RLE and LLE are the same size with no evidence of unilateral swelling  Neuro: CN 2-12 grossly intact Psych: not depressed or anxious appearing  EKG: personally reviewed my interpretation is similar to EKG taken in 2010, significant for sinus bradycardia, no new ST elevations or inversions, small amplitude of electrical activity   CXR: personally reviewed my interpretation is normal pulmonary vascular markings, no evidence of pulmonary edema, acute effusion, infiltrate or pneumothorax   Assessment & Plan by Problem: Active Problems:   Chest pain  #Near Syncope, Dizziness and Chest Pain- Undetermined origin Based on the patients family history of heart attack and bypass surgery in her father and vascular events in her mother in conjunction with her story of near syncope associated with chest pressure, radiating to the shoulder and associated with nausea and diaphoresis, acute coronary syndrome or unstable angina should be ruled out as part of the differential for this constellation of symptoms.  The patient has had an EKG and now 2 Troponins that do not suggest acute coronary syndrome. On physical exam, the patient has a loud S2, appreciated over the mitral area. Echocardiography will be necessary to rule out AS or MS or other causes of valvular cardiogenic syncope. She is not tachycardiac or tachypneic, has an O2 Sat >94% and no evidence or history to suggest a DVT making a PE unlikely. Her chest x-ray does not suggest any acute pathology and she has no signs to suggest an infectious etiology of today's events. Considering non-cardiac etiologies of the patient's presentation, orthostatic hypotension is possible but not fully supported by the patient's history. Her labs reveal mild hemo-concentration, which could be a sign of dehydration and lead to orthostatic hypotension. However, the patient states that she  had been walking in the store for around 5 minutes therefore as opposed to having a syncopal episode more close temporally to getting out of her car. The patient has a personal history of syncope that occurred when she was under stress and standing for a long period of time, which sounds vasovagal in nature. Both the diagnosis of vasovagal and orthostatic syncope can be considered only when more serious causes of near syncope.       -Echocardiography to assess for valvular abnormalities and acute wall motion abnormalities that might be missed on EKG -Repeat EKG in the morning, Troponin x1 @11  PM  -monitor on telemetry overnigt -1L NS fluid bolus in setting of hemoconcentration   #Hypertension The patient is hypertensive to the 170s-180s on presentation. At home she takes metoprolol for BP control.  -hold metoprolol in setting of bradycardia that could have contributed to pre-syncopal symptoms -if patient remains severely hypertensive-will want to make outpatient follow up to improve BP control   #Hyperlipidemia  -continue home dose of 20 mg Atorvastatin   #MDD -patient takes 20 mg  citalopram at night at home- continue while inpatient   FEN/GI: HH  PPx: Lovenox  Code: DNR  Dispo: Admit patient to Observation with expected length of stay less than 2 midnights.  Signed: Rhetta Mura, Medical Student 07/29/2017, 5:30 PM  Pager: [phone]  Attestation for Student Documentation:  I personally was present and performed or re-performed the history, physical exam and medical decision-making activities of this service and have verified that the service and findings are accurately documented in the student's note.  Alphonzo Grieve, MD 07/29/2017, 7:32 PM

## 2017-07-29 NOTE — Progress Notes (Signed)
Patient complaining of headache 3/10 post tylenol administration.  Patient would like to try something else for headache.  RN text paged Internal Medicine Residency with this information.

## 2017-07-29 NOTE — ED Notes (Signed)
Patient transported to X-ray 

## 2017-07-30 ENCOUNTER — Encounter (HOSPITAL_COMMUNITY): Payer: Self-pay | Admitting: Student

## 2017-07-30 ENCOUNTER — Observation Stay (HOSPITAL_BASED_OUTPATIENT_CLINIC_OR_DEPARTMENT_OTHER): Payer: PPO

## 2017-07-30 ENCOUNTER — Other Ambulatory Visit: Payer: Self-pay | Admitting: Student

## 2017-07-30 DIAGNOSIS — I340 Nonrheumatic mitral (valve) insufficiency: Secondary | ICD-10-CM | POA: Diagnosis not present

## 2017-07-30 DIAGNOSIS — R079 Chest pain, unspecified: Secondary | ICD-10-CM

## 2017-07-30 DIAGNOSIS — R0789 Other chest pain: Secondary | ICD-10-CM

## 2017-07-30 DIAGNOSIS — R42 Dizziness and giddiness: Secondary | ICD-10-CM

## 2017-07-30 DIAGNOSIS — E782 Mixed hyperlipidemia: Secondary | ICD-10-CM

## 2017-07-30 DIAGNOSIS — I351 Nonrheumatic aortic (valve) insufficiency: Secondary | ICD-10-CM

## 2017-07-30 DIAGNOSIS — I10 Essential (primary) hypertension: Secondary | ICD-10-CM

## 2017-07-30 DIAGNOSIS — R55 Syncope and collapse: Secondary | ICD-10-CM | POA: Diagnosis not present

## 2017-07-30 LAB — ECHOCARDIOGRAM COMPLETE
Height: 65 in
Weight: 3233.6 oz

## 2017-07-30 LAB — LIPID PANEL
CHOL/HDL RATIO: 3.2 ratio
Cholesterol: 155 mg/dL (ref 0–200)
HDL: 49 mg/dL (ref >40)
LDL CALC: 94 mg/dL (ref 0–99)
TRIGLYCERIDES: 60 mg/dL (ref <150)
VLDL: 12 mg/dL (ref 0–40)

## 2017-07-30 LAB — TROPONIN I: Troponin I: <0.03 ng/mL (ref <0.03)

## 2017-07-30 LAB — HIV ANTIBODY (ROUTINE TESTING W REFLEX): HIV Screen 4th Generation wRfx: NONREACTIVE

## 2017-07-30 MED ORDER — METOPROLOL SUCCINATE ER 25 MG PO TB24: 12.5000 mg | ORAL_TABLET | Freq: Every day | ORAL | 0 refills | Status: DC

## 2017-07-30 NOTE — Progress Notes (Signed)
  Echocardiogram 2D Echocardiogram has been performed.  Bobbye Charleston 07/30/2017, 8:50 AM

## 2017-07-30 NOTE — Discharge Instructions (Signed)

## 2017-07-30 NOTE — Progress Notes (Signed)
   Subjective:  Patient endorses feeling well this morning. She endorses occasional sharp left scapular pain that is not activity limiting. She denies chest pain, shortness of breath, further dizziness.   Objective:  Vital signs in last 24 hours: Vitals:   07/29/17 2105 07/30/17 0500 07/30/17 0559 07/30/17 1200  BP: 139/76 129/71 129/71 (!) 142/77  Pulse: (!) 53 (!) 52 (!) 52 (!) 53  Resp:  18  20  Temp: 98.5 F (36.9 C) 98.6 F (37 C) 98.6 F (37 C) 97.8 F (36.6 C)  TempSrc: Oral Oral Oral Oral  SpO2: 98% 96% 96% 97%  Weight:  202 lb 1.6 oz (91.7 kg)    Height:       Constitutional: NAD  CV: RRR, no m/r/g, loud S2 Resp: CTAB, no increased work of breathing Abd: soft, +BS Ext: minimal LE edema L>R, pulses intact  Assessment/Plan:  Active Problems:   Chest pain  Atypical chest pain Presyncope: EKG nonischemic, but does show bradycardia. Troponins negative. Echo today revealed no wall motion abnormality or significant valvular disease. Cardiology consulted and patient will f/u outpatient with [facility] cardiology for stress testing. Metoprolol will be decreased to 12.5mg  daily due to bradycardia. --decrease metorpolol to 12.5mg  daily --continue lipitor 20mg  daily --f/u with outpt cards for stress test  HTN: Due to bradycardia and normotension, will decrease metoprolol to 12.5mg  daily with close f/u with pcp; may need to switch to different antihypertensive if bradycardia persists  MDD: --continue celexa 20mg  daily  Dispo: Discharge home today.   Alphonzo Grieve, MD 07/30/2017, 12:25 PM Pager [phone]

## 2017-07-30 NOTE — Progress Notes (Signed)
Patient ambulated in hall per MD order by NT, DJ.  Patient denies chest pain or shortness of breath.  Will page Bernerd Pho, [place].

## 2017-07-30 NOTE — Progress Notes (Signed)
  Date: 07/30/2017  Patient name: CHALISA KOBLER  Medical record number: [id number]  Date of birth: 05-Oct-1950   I have seen and evaluated Chenay Louie Casa and discussed their care with the Residency Team. Ms Laubscher is a 67 yo female with HTN & HLD who presents with near syncope and CP.  She had been walking for a about 5 minutes at a flea market when she had the onset of lightheadedness, tunnel vision, dizziness, diaphoresis, and feeling like she was going to pass out.  She also had nausea and a loose bowel movement following this episode.  She did not lose consciousness.  She did feel chest pressure that radiated to her left shoulder along with dyspnea.  After 30 minutes without improvement, she went to the urgent care who sent her on to the ED.  She has no symptomatology when walking long distances or up stairs.  She has subsequently ruled out for an acute MI.  Her echo is essentially normal.  Cardiology is planning for an outpatient Myoview.  Vitals:   07/30/17 0500 07/30/17 0559  BP: 129/71 129/71  Pulse: (!) 52 (!) 52  Resp: 18   Temp: 98.6 F (37 C) 98.6 F (37 C)  SpO2: 96% 96%  Lying in bed, NAD HRRR loud S2, no MRG LCTAB with good air flow ABD + BS, soft Ext trace edema  A1C 5.4 LDL 94 HDL 49 Trop - x 5 HgB 15.2 TSH nl  I personally viewed the CXR images and confirmed my reading with the official read. PA and lateral, good quality, no overt abnl  I personally viewed the EKG and confirmed my reading with the official read. Low voltage, sinus brady, nl axis, nl intervals, no ischemic changes, unchanged from prior  ECHO EF 55-60%, no wall motion abnl, no valve abnl  Assessment and Plan: I have seen and evaluated the patient as outlined above. I agree with the formulated Assessment and Plan as detailed in the residents' note, with the following changes: Ms Kendrix is a 67 yo female with HTN & HLD who presents with near syncope and CP.  Her examination is normal with the exception of  bradycardia.  She has subsequently ruled out for an acute MI with troponins and EKGs.  Her echo does not show any abnormalities.  Other etiologies like orthostatic hypertension, vasovagal, PE, are less likely based on history and physical examination.  A panic attack would only be made after all other etiologies have been considered.  She will complete her work-up as an outpatient with a stress test.  1. D/C home 2. Outpt myoview per cards 3. Decrease BB 2/2 bradycardia 4. Cont statin and celexa  Bartholomew Crews, MD 7/13/201911:27 AM

## 2017-07-30 NOTE — Discharge Summary (Signed)
Name: Kristen Horne MRN: [id number] DOB: September 17, 1950 67 y.o. PCP: Kristen Collie, MD  Date of Admission: 07/29/2017 12:52 PM Date of Discharge: 07/30/2017 Attending Physician: Bartholomew Crews, MD  Discharge Diagnosis: 1. Atypical chest pain 2. Presyncope  Discharge Medications: Allergies as of 07/30/2017   No Known Allergies     Medication List    TAKE these medications   atorvastatin 20 MG tablet Commonly known as:  LIPITOR Take 20 mg by mouth at bedtime.   citalopram 40 MG tablet Commonly known as:  CELEXA Take 40 mg by mouth at bedtime.   meloxicam 15 MG tablet Commonly known as:  MOBIC Take 15 mg by mouth at bedtime.   metoprolol succinate 25 MG 24 hr tablet Commonly known as:  TOPROL-XL Take 0.5 tablets (12.5 mg total) by mouth at bedtime. What changed:  how much to take       Disposition and follow-up:   Ms.Kristen Horne was discharged from [facility] in Stable condition.  At the hospital follow up visit please address:  1.   Atypical chest pain: Ensure patient follows up with cardiology for stress testing  HTN Bradycardia: Metoprolol was decreased 12.5mg  daily Assess blood pressure control at f/u  2.  Labs / imaging needed at time of follow-up: n/a  3.  Pending labs/ test needing follow-up: n/a  Follow-up Appointments: Follow-up Information    Richardo Priest, MD Follow up.   Specialty:  Cardiology Why:  The office should contact you within 2-3 business days to arrange for a stress test and follow-up. If you do not hear from them during this time frame, please call the number provided.  Contact information: [address] [place] [address] [phone]        Kristen Collie, MD. Schedule an appointment as soon as possible for a visit in 1 week(s).   Specialty:  Family Medicine Contact information: [place] [address] [place] Hospital Course by problem  list:  Presyncope Atypical chest pain: Patient presented with prolonged episode of presyncope while shopping associated with dizziness first, then accompanied by chest tightness, nausea, one episode of vomiting. She has risk factors of hypertension, hyperlipidemia and family history of heart disease. Her lab work on admission revealed mild hemoconcentrated with normal renal function; EKG was without ischemic changes. She was found to be mildly bradycardic. Her symptoms resolved with IVF. Troponins were trended overnight and remained negative. Overnight telemetry did not reveal any arrhythmias other than sinus bradycardia. Echo did not reveal any focal wall motion abnormalities or significant valvular disease (mild AVR, mild MVR, mild TVR). She was evaluated by cardiology who recommended outpatient stress test due to her risk factors. A1c this admission was 5.4; lipid panel was wnl and she still qualified for the moderate intensity statin that she was on prior to hospitalization. She will follow up with a cardiologist in [place]; her metoprolol was reduced to 12.5mg  daily, and was continued on her atorvastatin 20mg  daily.  HTN: Patient on metoprolol 25mg  daily prior to admission for control of HTN. During hospitalization she was found to have sinus bradycardia mild hypertension when her home dose was held. On discharge, the metoprolol was decreased to 12.5mg  daily and she was instructed to follow up with her primary care doctor for further titration and management of HTN.   Discharge Vitals:   BP (!) 142/77 (BP Location: Left Arm)   Pulse Marland Kitchen)  53   Temp 97.8 F (36.6 C) (Oral)   Resp 20   Ht 5\' 5"  (1.651 m)   Wt 202 lb 1.6 oz (91.7 kg)   SpO2 97%   BMI 33.63 kg/m   Pertinent Labs, Studies, and Procedures:  CBC    Component Value Date/Time   WBC 6.9 07/29/2017 1311   RBC 5.28 (H) 07/29/2017 1311   HGB 15.2 (H) 07/29/2017 1311   HCT 47.8 (H) 07/29/2017 1311   PLT 216 07/29/2017 1311   MCV  90.5 07/29/2017 1311   MCH 28.8 07/29/2017 1311   MCHC 31.8 07/29/2017 1311   RDW 12.5 07/29/2017 1311   BMET    Component Value Date/Time   NA 141 07/29/2017 1311   K 4.4 07/29/2017 1311   CL 106 07/29/2017 1311   CO2 25 07/29/2017 1311   GLUCOSE 117 (H) 07/29/2017 1311   BUN 21 07/29/2017 1311   CREATININE 0.93 07/29/2017 1311   CALCIUM 9.2 07/29/2017 1311   GFRNONAA >60 07/29/2017 1311   GFRAA >60 07/29/2017 1311   Lipid Panel     Component Value Date/Time   CHOL 155 07/30/2017 0433   TRIG 60 07/30/2017 0433   HDL 49 07/30/2017 0433   CHOLHDL 3.2 07/30/2017 0433   VLDL 12 07/30/2017 0433   LDLCALC 94 07/30/2017 0433   Troponin 07/29/17: negative x3 HIV 07/29/2017: nonreactive Hemoglobin A1c 07/29/2017: 5.4  Echocardiogram 07/30/2017: Study Conclusions - Left ventricle: The cavity size was normal. Wall thickness was   normal. Systolic function was normal. The estimated ejection   fraction was in the range of 55% to 60%. Wall motion was normal;   there were no regional wall motion abnormalities. Left   ventricular diastolic function parameters were normal. - Aortic valve: There was mild regurgitation. - Mitral valve: There was mild regurgitation. - Right atrium: Central venous pressure (est): 8 mm Hg. - Atrial septum: No defect or patent foramen ovale was identified. - Tricuspid valve: There was trivial regurgitation. - Pulmonary arteries: Systolic pressure could not be accurately   estimated. - Pericardium, extracardiac: There was no pericardial effusion.   Discharge Instructions: Discharge Instructions    Call MD for:  difficulty breathing, headache or visual disturbances   Complete by:  As directed    Call MD for:  extreme fatigue   Complete by:  As directed    Call MD for:  hives   Complete by:  As directed    Call MD for:  persistant dizziness or light-headedness   Complete by:  As directed    Call MD for:  persistant nausea and vomiting   Complete by:   As directed    Call MD for:  redness, tenderness, or signs of infection (pain, swelling, redness, odor or green/yellow discharge around incision site)   Complete by:  As directed    Call MD for:  severe uncontrolled pain   Complete by:  As directed    Call MD for:  temperature >100.4   Complete by:  As directed    Diet - low sodium heart healthy   Complete by:  As directed    Discharge instructions   Complete by:  As directed    You were seen for dizziness and chest pain. You will be scheduled for an outpatient stress test with follow up with cardiology in [place]. Because your heart rate was a little slow, please decrease you metoprolol to half a pill (12.5mg ) daily; follow up with your primary care provider within  a week or two, if your heart rate is still on the lower side, she may need to switch you blood pressure medication to something that doesn't affect the heart rate as much.   Increase activity slowly   Complete by:  As directed       Signed: Alphonzo Grieve, MD 07/30/2017, 1:21 PM

## 2017-07-30 NOTE — Consult Note (Addendum)
Cardiology Consult    Patient ID: Kristen Horne; [id number]; [birth year]   Admit date: 07/29/2017 Date of Consult: 07/30/2017  Primary Care Provider: Marco Collie, MD Primary Cardiologist: New to [facility] --> lives in [place] and wishes to follow-up with Dr. Bettina Gavia (former Cardiologist for her parents)  Patient Profile    Kristen Horne is a 67 y.o. female with past medical history of HTN, HLD, and OA who is being seen today for the evaluation of presyncope and chest pain at the request of Dr. Lynnae January.   History of Present Illness    Kristen Horne presented to [facility] ED on 07/29/2017 for evaluation of a presyncopal event. Reports that she experienced an episode of chest pain last weekend which radiated from her sternum to her left shoulder and only lasted for seconds at a time. Had been in her usual state of health until yesterday when she was walking around a flea market with her husband and developed vision changes and dizziness. She informed him she felt like she was going to pass out, therefore she sat in a chair and once symptoms improved, he took her to a local urgent care. She reports associated palpitations and diaphoresis at that time but denies any chest discomfort. Did not lose consciousness. She is active at baseline and walks regularly for exercise, denying any recent chest pain or dyspnea on exertion with this. Reports she did experience syncopal episodes 10+ years ago but denies any recent events. Has known HTN and HLD but denies any history of CAD or cardiac arrhythmias. Mother had atrial fibrillation and her father had known CAD. She denies any prior tobacco use, alcohol use, or recreational drug use.   Initial labs show WBC 6.9, Hgb 15.2, platelets 216, Na+ 141, K+ 4.4, and creatinine 0.93. TSH 1.021. Initial and cyclic troponin values have been negative. CXR shows slight scarring of left base with no edema or consolidation. EKG shows sinus bradycardia, HR 57, with low-voltage. No  diagnostic ST abnormalities with no prior tracings available for comparison.  Has been monitored on telemetry with HR in the high-40's to 50's. No significant pauses or arrhythmias. Reports feeling back to baseline at this time.    Past Medical History:  Diagnosis Date  . Arthritis   . Hyperlipemia   . Hypertension     History reviewed. No pertinent surgical history.   Home Medications:  Prior to Admission medications   Medication Sig Start Date End Date Taking? Authorizing Provider  atorvastatin (LIPITOR) 20 MG tablet Take 20 mg by mouth at bedtime.  06/27/17  Yes [provider]  citalopram (CELEXA) 40 MG tablet Take 40 mg by mouth at bedtime.  06/26/17  Yes [provider]  meloxicam (MOBIC) 15 MG tablet Take 15 mg by mouth at bedtime.  06/20/17  Yes [provider]  metoprolol succinate (TOPROL-XL) 25 MG 24 hr tablet Take 25 mg by mouth at bedtime. 07/03/17  Yes [provider]    Inpatient Medications: Scheduled Meds: . atorvastatin  20 mg Oral q1800  . citalopram  20 mg Oral QHS  . enoxaparin (LOVENOX) injection  40 mg Subcutaneous Q24H   Continuous Infusions:  PRN Meds: acetaminophen **OR** acetaminophen  Allergies:   No Known Allergies  Social History:   Social History   Socioeconomic History  . Marital status: Married    Spouse name: Not on file  . Number of children: Not on file  . Years of education: Not on file  .  Highest education level: Not on file  Occupational History  . Not on file  Social Needs  . Financial resource strain: Not on file  . Food insecurity:    Worry: Not on file    Inability: Not on file  . Transportation needs:    Medical: Not on file    Non-medical: Not on file  Tobacco Use  . Smoking status: Never Smoker  . Smokeless tobacco: Never Used  Substance and Sexual Activity  . Alcohol use: Not on file  . Drug use: Not on file  . Sexual activity: Not on file  Lifestyle  . Physical activity:     Days per week: Not on file    Minutes per session: Not on file  . Stress: Not on file  Relationships  . Social connections:    Talks on phone: Not on file    Gets together: Not on file    Attends religious service: Not on file    Active member of club or organization: Not on file    Attends meetings of clubs or organizations: Not on file    Relationship status: Not on file  . Intimate partner violence:    Fear of current or ex partner: Not on file    Emotionally abused: Not on file    Physically abused: Not on file    Forced sexual activity: Not on file  Other Topics Concern  . Not on file  Social History Narrative  . Not on file     Family History:    Family History  Problem Relation Age of Onset  . Atrial fibrillation Mother   . Coronary artery disease Father        s/p CABG in his 20's      Review of Systems    General:  No chills, fever, night sweats or weight changes.  Cardiovascular:  No chest pain, dyspnea on exertion, edema, orthopnea, palpitations, paroxysmal nocturnal dyspnea. Positive for presyncope and chest pain.  Dermatological: No rash, lesions/masses Respiratory: No cough, dyspnea Urologic: No hematuria, dysuria Abdominal:   No nausea, vomiting, diarrhea, bright red blood per rectum, melena, or hematemesis Neurologic:  No visual changes, wkns, changes in mental status.  All other systems reviewed and are otherwise negative except as noted above.  Physical Exam/Data    Vitals:   07/29/17 1550 07/29/17 2105 07/30/17 0500 07/30/17 0559  BP: (!) 163/92 139/76 129/71 129/71  Pulse: (!) 59 (!) 53 (!) 52 (!) 52  Resp: 18  18   Temp: (!) 97.4 F (36.3 C) 98.5 F (36.9 C) 98.6 F (37 C) 98.6 F (37 C)  TempSrc: Oral Oral Oral Oral  SpO2: 100% 98% 96% 96%  Weight: 203 lb 4.2 oz (92.2 kg)  202 lb 1.6 oz (91.7 kg)   Height: 5\' 5"  (1.651 m)       Intake/Output Summary (Last 24 hours) at 07/30/2017 1004 Last data filed at 07/29/2017 2100 Gross per 24  hour  Intake 1340 ml  Output -  Net 1340 ml   Filed Weights   07/29/17 1550 07/30/17 0500  Weight: 203 lb 4.2 oz (92.2 kg) 202 lb 1.6 oz (91.7 kg)   Body mass index is 33.63 kg/m.   General: Pleasant, Caucasian female appearing in NAD Psych: Normal affect. Neuro: Alert and oriented X 3. Moves all extremities spontaneously. HEENT: Normal  Neck: Supple without bruits or JVD. Lungs:  Resp regular and unlabored, CTA without wheezing or rales. Heart: RRR no s3, s4, 2/6  SEM along Apex.  Abdomen: Soft, non-tender, non-distended, BS + x 4.  Extremities: No clubbing, cyanosis or edema. DP/PT/Radials 2+ and equal bilaterally.   EKG:  The EKG was personally reviewed and demonstrates:  Sinus bradycardia, HR 57, with low-voltage. No diagnostic ST abnormalities with no prior tracings available for comparison.    Labs/Studies     Relevant CV Studies:  Echocardiogram: Pending  Laboratory Data:  Chemistry Recent Labs  Lab 07/29/17 1311  NA 141  K 4.4  CL 106  CO2 25  GLUCOSE 117*  BUN 21  CREATININE 0.93  CALCIUM 9.2  GFRNONAA >60  GFRAA >60  ANIONGAP 10    No results for input(s): PROT, ALBUMIN, AST, ALT, ALKPHOS, BILITOT in the last 168 hours. Hematology Recent Labs  Lab 07/29/17 1311  WBC 6.9  RBC 5.28*  HGB 15.2*  HCT 47.8*  MCV 90.5  MCH 28.8  MCHC 31.8  RDW 12.5  PLT 216   Cardiac Enzymes Recent Labs  Lab 07/29/17 1523 07/29/17 1628 07/29/17 2152 07/30/17 0433  TROPONINI <0.03 <0.03 <0.03 <0.03    Recent Labs  Lab 07/29/17 1316  TROPIPOC 0.00    BNPNo results for input(s): BNP, PROBNP in the last 168 hours.  DDimer No results for input(s): DDIMER in the last 168 hours.  Radiology/Studies:  Dg Chest 2 View  Result Date: 07/29/2017 CLINICAL DATA:  Chest pain EXAM: CHEST - 2 VIEW COMPARISON:  None. FINDINGS: There is minimal scarring in the left base. Lungs elsewhere are clear. Heart is mildly enlarged with pulmonary vascularity normal. No  adenopathy. No pneumothorax. No bone lesions. IMPRESSION: Slight scarring left base. No edema or consolidation. Heart mildly enlarged. Electronically Signed   By: Lowella Grip III M.D.   On: 07/29/2017 13:21     Assessment & Plan    1. Atypical Chest Pain - she reports an episode of chest pain last weekend which radiated into her left shoulder and only lasted for a few seconds. Has been walking for exercise without any exertional chest pain or dyspnea on exertion. Reports a mild discomfort this morning which is along her sternum and has been present since admission.  - Initial and cyclic troponin values have been negative. EKG shows sinus bradycardia, HR 57, with low-voltage. No diagnostic ST abnormalities with no prior tracings available for comparison. - her recent symptoms seem atypical for a cardiac etiology. An echocardiogram was performed earlier this morning to assess LV function and wall motion. Official report is pending.  - she does have multiple cardiac risk factors including HTN, HLD, and family history of CAD. Would benefit from a stress test for ischemic evaluation and will discuss with Dr. Domenic Polite to determine timing in regards to inpatient versus outpatient.   2. Presyncope - had a presyncopal episode the day of admission which occurred while walking around a store. Notes associated vision changes, palpitations, and diaphoresis at that time. Symptoms improved with sitting down. Denies an actual LOC. Received IVF at the time of admission and now feels back to baseline.  - possibly secondary to a vasovagal etiology. Telemetry has shown sinus bradycardia with HR in the high-40's to 50's with no significant arrhythmias. Was on Toprol-XL 25mg  daily PTA. Would recommend reducing to 12.5mg  daily. May need to switch to Amlodipine or Losartan as an outpatient pending HR and BP response.  - echocardiogram is pending to assess for structural abnormalities.   3. HTN - BP initially  elevated at 180/88 upon admission, improved to 129/71 on  most recent check.  - on Toprol-XL 25mg  daily as an outpatient. Recommend reducing to 12.5mg  daily as outlined above or switching to a different agent.  4. HLD - followed by PCP as an outpatient. FLP this admission shows total cholesterol of 155, HDL 49, and LDL 94. - on Atorvastatin 20mg  daily PTA.    For questions or updates, please contact [place] Please consult [email or web address] for contact info under Cardiology/STEMI.  Signed, Erma Heritage, PA-C 07/30/2017, 10:04 AM Pager: [phone]   Attending note:  Patient seen and examined.  I reviewed her records and discussed the case with Kristen Horne, I agree with her findings.  Kristen Horne presents to the hospital after recent episode of lightheadedness as well as somewhat atypical chest discomfort.  She had been walking around at a flea market when the symptoms developed.  Also states in retrospect that she has been having some sharp upper chest and left shoulder discomfort without obvious precipitant recently.  She does not report regular, reproducible chest pain with exertion.  She has had a previous episode of syncope, question vasovagal component based on description.  Otherwise cardiac risk factors include hypertension and hyperlipidemia, her father with history of heart disease.  On examination this morning she appears comfortable.  Has received IV fluids.  Heart rate has been as low as the high 40s to 50s in sinus rhythm by telemetry, otherwise only with rare PACs.  Systolic blood pressure in the 120s.  Lungs are clear without labored breathing.  Cardiac exam reveals RRR without gallop.  Review of lab work finds normal troponin I levels x5.  Potassium 4.4, creatinine 0.93, LDL 94, hemoglobin 15.2, platelets 216.  Chest x-ray reports scarring at the left base and also mild cardiomegaly.  I personally reviewed her ECG which shows sinus bradycardia with low voltage and  poor R wave progression.  Echocardiogram performed today shows LVEF 55 to 60% without obvious wall motion abnormalities, normal diastolic function, mild aortic and mitral regurgitation.  No pericardial effusion.  Patient states that she feels better today.  Would have her ambulate in the hall and if no further symptoms consider discharge with plan for an outpatient Myoview.  This could be arranged in [place] with plan to follow-up with Dr. Bettina Gavia.  Would reduce Toprol-XL 12.5 mg daily for now in light of recent bradycardia.  Satira Sark, M.D., F.A.C.C.

## 2017-08-01 ENCOUNTER — Encounter: Payer: Self-pay | Admitting: Student

## 2017-08-01 ENCOUNTER — Other Ambulatory Visit: Payer: Self-pay | Admitting: Student

## 2017-08-01 DIAGNOSIS — I129 Hypertensive chronic kidney disease with stage 1 through stage 4 chronic kidney disease, or unspecified chronic kidney disease: Secondary | ICD-10-CM | POA: Diagnosis not present

## 2017-08-01 DIAGNOSIS — E782 Mixed hyperlipidemia: Secondary | ICD-10-CM | POA: Diagnosis not present

## 2017-08-01 DIAGNOSIS — N182 Chronic kidney disease, stage 2 (mild): Secondary | ICD-10-CM | POA: Diagnosis not present

## 2017-08-01 DIAGNOSIS — R079 Chest pain, unspecified: Secondary | ICD-10-CM | POA: Diagnosis not present

## 2017-08-03 DIAGNOSIS — R079 Chest pain, unspecified: Secondary | ICD-10-CM | POA: Diagnosis not present

## 2017-08-08 ENCOUNTER — Encounter (HOSPITAL_COMMUNITY): Payer: PPO

## 2017-08-08 DIAGNOSIS — Z6834 Body mass index (BMI) 34.0-34.9, adult: Secondary | ICD-10-CM | POA: Diagnosis not present

## 2017-08-08 DIAGNOSIS — I129 Hypertensive chronic kidney disease with stage 1 through stage 4 chronic kidney disease, or unspecified chronic kidney disease: Secondary | ICD-10-CM | POA: Diagnosis not present

## 2017-08-11 DIAGNOSIS — I129 Hypertensive chronic kidney disease with stage 1 through stage 4 chronic kidney disease, or unspecified chronic kidney disease: Secondary | ICD-10-CM | POA: Diagnosis not present

## 2017-08-11 DIAGNOSIS — E782 Mixed hyperlipidemia: Secondary | ICD-10-CM | POA: Diagnosis not present

## 2017-08-11 DIAGNOSIS — R7303 Prediabetes: Secondary | ICD-10-CM | POA: Diagnosis not present

## 2017-08-14 DIAGNOSIS — G4733 Obstructive sleep apnea (adult) (pediatric): Secondary | ICD-10-CM | POA: Diagnosis not present

## 2017-08-18 DIAGNOSIS — R7303 Prediabetes: Secondary | ICD-10-CM | POA: Diagnosis not present

## 2017-08-18 DIAGNOSIS — I129 Hypertensive chronic kidney disease with stage 1 through stage 4 chronic kidney disease, or unspecified chronic kidney disease: Secondary | ICD-10-CM | POA: Diagnosis not present

## 2017-08-18 DIAGNOSIS — E782 Mixed hyperlipidemia: Secondary | ICD-10-CM | POA: Diagnosis not present

## 2017-08-18 DIAGNOSIS — N182 Chronic kidney disease, stage 2 (mild): Secondary | ICD-10-CM | POA: Diagnosis not present

## 2017-08-18 NOTE — Progress Notes (Signed)
Cardiology Office Note:    Date:  08/19/2017   ID:  Kristen Horne, DOB 03/06/1950, MRN [id number]  PCP:  Marco Collie, MD  Cardiologist:  Shirlee More, MD   Referring MD: Marco Collie, MD  ASSESSMENT:    1. Palpitation   2. Near syncope   3. Essential hypertension   4. Hyperlipidemia, unspecified hyperlipidemia type   5. Chest pain, unspecified type    PLAN:    In order of problems listed above:  1. I am going to utilize 14-day extended Holter monitor to assess for arrhythmia 2. Associated with bradycardia I think is best to discontinue her beta-blocker and has a minimal effect on blood pressure and at this time I do not think she requires an antihypertensive drug 3. See above 4. Stable continue current treatment 5. Nonanginal nonischemic chest pain I would not refer her for further evaluation of CAD  Next appointment 6 weeks   Medication Adjustments/Labs and Tests Ordered: Current medicines are reviewed at length with the patient today.  Concerns regarding medicines are outlined above.  No orders of the defined types were placed in this encounter.  No orders of the defined types were placed in this encounter.    No chief complaint on file.   History of Present Illness:    Kristen Horne is a 67 y.o. female with hypertension hyperlipidemia who is being seen today for the evaluation of chest pain after recent [facility] admission 07/29/17 at the request of Marco Collie, MD. He was admitted to [facility] 07/29/2017 with an episode of near syncope and chest discomfort.  She had no evidence of ACS by EKG or troponin criteria and was discharged for an outpatient cardiac evaluation.  There is a comment that she was mildly bradycardic and her dose of beta-blocker taken for hypertension was diminished.  Echocardiogram performed in the hospital showed normal left ventricular function mild aortic mitral and tricuspid regurgitation she was seen by cardiology in consultation.  I reviewed  the consultation by Dr. Domenic Polite she was noted to have bradycardia with rates in the 40s and 50s on the telemetry without pauses.Her MOI at [facility] 08/03/17 showed normal perfusion, no ischemia, EF 65%.   Since that admission to the hospital she continues to not feel well she is having episodes of palpitation and again nonexertional chest discomfort that is positional.  She is reassured by her normal myocardial perfusion study has had no recurrent syncope or near syncope.  No shortness of breath edema syncope or TIA. Past Medical History:  Diagnosis Date  . Arthritis   . Hyperlipemia   . Hypertension     Past Surgical History:  Procedure Laterality Date  . ABDOMINAL HYSTERECTOMY  1999  . BREAST REDUCTION SURGERY    . TUMOR REMOVAL      Current Medications: Current Meds  Medication Sig  . aspirin EC 81 MG tablet Take 81 mg by mouth daily.  Marland Kitchen atorvastatin (LIPITOR) 20 MG tablet Take 20 mg by mouth at bedtime.   . citalopram (CELEXA) 40 MG tablet Take 40 mg by mouth at bedtime.   . meloxicam (MOBIC) 15 MG tablet Take 15 mg by mouth at bedtime.   . [DISCONTINUED] metoprolol succinate (TOPROL-XL) 25 MG 24 hr tablet Take 0.5 tablets (12.5 mg total) by mouth at bedtime.     Allergies:   Patient has no known allergies.   Social History   Socioeconomic History  . Marital status: Married    Spouse name: Not on  file  . Number of children: Not on file  . Years of education: Not on file  . Highest education level: Not on file  Occupational History  . Not on file  Social Needs  . Financial resource strain: Not on file  . Food insecurity:    Worry: Not on file    Inability: Not on file  . Transportation needs:    Medical: Not on file    Non-medical: Not on file  Tobacco Use  . Smoking status: Never Smoker  . Smokeless tobacco: Never Used  Substance and Sexual Activity  . Alcohol use: Not on file  . Drug use: Not on file  . Sexual activity: Not on file  Lifestyle  . Physical  activity:    Days per week: Not on file    Minutes per session: Not on file  . Stress: Not on file  Relationships  . Social connections:    Talks on phone: Not on file    Gets together: Not on file    Attends religious service: Not on file    Active member of club or organization: Not on file    Attends meetings of clubs or organizations: Not on file    Relationship status: Not on file  Other Topics Concern  . Not on file  Social History Narrative  . Not on file     Family History: The patient's family history includes Atrial fibrillation in her mother; Coronary artery disease in her father.  ROS:   ROS Please see the history of present illness.     All other systems reviewed and are negative.  EKGs/Labs/Other Studies Reviewed:    The following studies were reviewed today: [facility] records as well as myocardial perfusion study done at [facility] reviewed prior to the visit   Recent Labs: 07/29/2017: BUN 21; Creatinine, Ser 0.93; Hemoglobin 15.2; Platelets 216; Potassium 4.4; Sodium 141; TSH 1.021  Recent Lipid Panel    Component Value Date/Time   CHOL 155 07/30/2017 0433   TRIG 60 07/30/2017 0433   HDL 49 07/30/2017 0433   CHOLHDL 3.2 07/30/2017 0433   VLDL 12 07/30/2017 0433   LDLCALC 94 07/30/2017 0433    Physical Exam:    VS:  BP 128/72 (BP Location: Right Arm, Patient Position: Sitting, Cuff Size: Normal)   Pulse 65   Ht 5\' 5"  (1.651 m)   Wt 203 lb (92.1 kg)   SpO2 98%   BMI 33.78 kg/m     Wt Readings from Last 3 Encounters:  08/19/17 203 lb (92.1 kg)  07/30/17 202 lb 1.6 oz (91.7 kg)     GEN:  Well nourished, well developed in no acute distress HEENT: Normal NECK: No JVD; No carotid bruits LYMPHATICS: No lymphadenopathy CARDIAC: RRR, no murmurs, rubs, gallops RESPIRATORY:  Clear to auscultation without rales, wheezing or rhonchi  ABDOMEN: Soft, non-tender, non-distended MUSCULOSKELETAL:  No edema; No deformity  SKIN: Warm and  dry NEUROLOGIC:  Alert and oriented x 3 PSYCHIATRIC:  Normal affect     Signed, Shirlee More, MD  08/19/2017 8:59 AM    [place]

## 2017-08-19 ENCOUNTER — Other Ambulatory Visit: Payer: Self-pay | Admitting: Cardiology

## 2017-08-19 ENCOUNTER — Encounter: Payer: Self-pay | Admitting: Cardiology

## 2017-08-19 ENCOUNTER — Ambulatory Visit (INDEPENDENT_AMBULATORY_CARE_PROVIDER_SITE_OTHER): Payer: PPO | Admitting: Cardiology

## 2017-08-19 VITALS — BP 128/72 | HR 65 | Ht 65.0 in | Wt 203.0 lb

## 2017-08-19 DIAGNOSIS — R002 Palpitations: Secondary | ICD-10-CM

## 2017-08-19 DIAGNOSIS — R55 Syncope and collapse: Secondary | ICD-10-CM | POA: Diagnosis not present

## 2017-08-19 DIAGNOSIS — R079 Chest pain, unspecified: Secondary | ICD-10-CM

## 2017-08-19 DIAGNOSIS — E785 Hyperlipidemia, unspecified: Secondary | ICD-10-CM

## 2017-08-19 DIAGNOSIS — I10 Essential (primary) hypertension: Secondary | ICD-10-CM

## 2017-08-19 HISTORY — DX: Hyperlipidemia, unspecified: E78.5

## 2017-08-19 HISTORY — DX: Essential (primary) hypertension: I10

## 2017-08-19 NOTE — Patient Instructions (Addendum)
Medication Instructions:  Your physician has recommended you make the following change in your medication:  STOP metoprolol  Labwork: None  Testing/Procedures: Your physician has recommended that you wear a holter monitor. Holter monitors are medical devices that record the heart's electrical activity. Doctors most often use these monitors to diagnose arrhythmias. Arrhythmias are problems with the speed or rhythm of the heartbeat. The monitor is a small, portable device. You can wear one while you do your normal daily activities. This is usually used to diagnose what is causing palpitations/syncope (passing out).  Follow-Up: Your physician recommends that you schedule a follow-up appointment in: 6 weeks  Any Other Special Instructions Will Be Listed Below (If Applicable).   1. Avoid all over-the-counter antihistamines except Claritin/Loratadine and Zyrtec/Cetrizine. 2. Avoid all combination including cold sinus allergies flu decongestant and sleep medications 3. You can use Robitussin DM Mucinex and Mucinex DM for cough. 4. can use Tylenol aspirin ibuprofen and naproxen but no combinations such as sleep or sinus.  If you need a refill on your cardiac medications before your next appointment, please call your pharmacy.   [place], RN, BSN

## 2017-08-22 DIAGNOSIS — I129 Hypertensive chronic kidney disease with stage 1 through stage 4 chronic kidney disease, or unspecified chronic kidney disease: Secondary | ICD-10-CM | POA: Diagnosis not present

## 2017-08-22 DIAGNOSIS — Z6835 Body mass index (BMI) 35.0-35.9, adult: Secondary | ICD-10-CM | POA: Diagnosis not present

## 2017-08-22 DIAGNOSIS — N182 Chronic kidney disease, stage 2 (mild): Secondary | ICD-10-CM | POA: Diagnosis not present

## 2017-08-29 ENCOUNTER — Ambulatory Visit (INDEPENDENT_AMBULATORY_CARE_PROVIDER_SITE_OTHER): Payer: PPO

## 2017-08-29 DIAGNOSIS — R002 Palpitations: Secondary | ICD-10-CM

## 2017-09-05 DIAGNOSIS — I129 Hypertensive chronic kidney disease with stage 1 through stage 4 chronic kidney disease, or unspecified chronic kidney disease: Secondary | ICD-10-CM | POA: Diagnosis not present

## 2017-09-05 DIAGNOSIS — N182 Chronic kidney disease, stage 2 (mild): Secondary | ICD-10-CM | POA: Diagnosis not present

## 2017-09-05 DIAGNOSIS — Z6835 Body mass index (BMI) 35.0-35.9, adult: Secondary | ICD-10-CM | POA: Diagnosis not present

## 2017-09-05 DIAGNOSIS — H25813 Combined forms of age-related cataract, bilateral: Secondary | ICD-10-CM | POA: Diagnosis not present

## 2017-09-14 DIAGNOSIS — G4733 Obstructive sleep apnea (adult) (pediatric): Secondary | ICD-10-CM | POA: Diagnosis not present

## 2017-09-29 NOTE — Progress Notes (Signed)
Cardiology Office Note:    Date:  09/30/2017   ID:  Kristen Horne, DOB April 11, 1950, MRN [id number]  PCP:  Marco Collie, MD  Cardiologist:  Shirlee More, MD    Referring MD: Marco Collie, MD    ASSESSMENT:    1. Palpitation   2. Hyperlipidemia, unspecified hyperlipidemia type   3. Angina pectoris ([place])    PLAN:    In order of problems listed above:  1. Worsened off her beta-blocker and will ask her to resume it note she was not receiving CPAP which she was in the hospital for sleep apnea and unsure the significance of the bradycardia she exhibited.  Her monitor that was performed as an outpatient has no significant bradycardia arrhythmia. 2. Stable continue statin 3. Worsened she is having frequent episodes particularly night of angina substernal pressure shortness of breath improved sitting up but relieved with nitroglycerin.  She has had a normal myocardial perfusion study for follow-up of cardiac CTA performed if she has high risk markers are extensive ischemia require further evaluation with angiography.  She has had previous normal myocardial perfusion study   Next appointment: 2 months   Medication Adjustments/Labs and Tests Ordered: Current medicines are reviewed at length with the patient today.  Concerns regarding medicines are outlined above.  Orders Placed This Encounter  Procedures  . CT CORONARY MORPH W/CTA COR W/SCORE W/CA W/CM &/OR WO/CM  . CT CORONARY FRACTIONAL FLOW RESERVE DATA PREP  . CT CORONARY FRACTIONAL FLOW RESERVE FLUID ANALYSIS  . EKG 12-Lead   No orders of the defined types were placed in this encounter.   Chief Complaint  Patient presents with  . Palpitations    History of Present Illness:    Kristen Horne is a 67 y.o. female with a hx of palpitation last seen 08/19/17. Compliance with diet, lifestyle and medications: Yes  She is not doing well she is only frequent episodes of palpitation brief rapid heart rhythm and her monitor showed brief  episodes of atrial tachycardia.  She has sleep apnea compliant with CPAP in the hospital when she is bradycardic was not receiving positive pressure ventilation she had no significant bradycardia in the event monitor low-dose beta-blocker.  The real problem today is that she does not feel well with fatigue improved with CPAP and is having frequent episodes at nighttime anginal discomfort substernal pressure moderate to severe no radiation associated shortness of breath with setting up but also relieved with nitroglycerin.  His abnormal myocardial perfusion imaging will undergo cardiac CTA to evaluate chronic chest pain Past Medical History:  Diagnosis Date  . Arthritis   . Hyperlipemia   . Hypertension     Past Surgical History:  Procedure Laterality Date  . ABDOMINAL HYSTERECTOMY  1999  . BREAST REDUCTION SURGERY    . TUMOR REMOVAL      Current Medications: Current Meds  Medication Sig  . aspirin EC 81 MG tablet Take 81 mg by mouth Kristen.  Marland Kitchen atorvastatin (LIPITOR) 20 MG tablet Take 20 mg by mouth at bedtime.   . citalopram (CELEXA) 40 MG tablet Take 40 mg by mouth at bedtime.   . meloxicam (MOBIC) 15 MG tablet Take 15 mg by mouth at bedtime.   . nitroGLYCERIN (NITROSTAT) 0.4 MG SL tablet Place 1 tablet under the tongue every 5 (five) minutes as needed.     Allergies:   Patient has no known allergies.   Social History   Socioeconomic History  . Marital status: Married  Spouse name: Not on file  . Number of children: Not on file  . Years of education: Not on file  . Highest education level: Not on file  Occupational History  . Not on file  Social Needs  . Financial resource strain: Not on file  . Food insecurity:    Worry: Not on file    Inability: Not on file  . Transportation needs:    Medical: Not on file    Non-medical: Not on file  Tobacco Use  . Smoking status: Never Smoker  . Smokeless tobacco: Never Used  Substance and Sexual Activity  . Alcohol use: Not  Currently  . Drug use: Never  . Sexual activity: Not on file  Lifestyle  . Physical activity:    Days per week: Not on file    Minutes per session: Not on file  . Stress: Not on file  Relationships  . Social connections:    Talks on phone: Not on file    Gets together: Not on file    Attends religious service: Not on file    Active member of club or organization: Not on file    Attends meetings of clubs or organizations: Not on file    Relationship status: Not on file  Other Topics Concern  . Not on file  Social History Narrative  . Not on file     Family History: The patient's family history includes Atrial fibrillation in her mother; Coronary artery disease in her father. ROS:   Please see the history of present illness.    All other systems reviewed and are negative.  EKGs/Labs/Other Studies Reviewed:    The following studies were reviewed today:   EKG:  EKG ordered today.  The ekg ordered today demonstrates [place] and normal  Long term Holter monitor: Occasional supraventricular ectopic noted less than 1% burden with 3643 APCs 724 couplets and 3 runs of APCs.  Longest 21 complex the past is 166 bpm.  This is consistent with atrial tachycardia there were no episodes of atrial fibrillation or flutter  Recent Labs: 07/29/2017: BUN 21; Creatinine, Ser 0.93; Hemoglobin 15.2; Platelets 216; Potassium 4.4; Sodium 141; TSH 1.021  Recent Lipid Panel    Component Value Date/Time   CHOL 155 07/30/2017 0433   TRIG 60 07/30/2017 0433   HDL 49 07/30/2017 0433   CHOLHDL 3.2 07/30/2017 0433   VLDL 12 07/30/2017 0433   LDLCALC 94 07/30/2017 0433    Physical Exam:    VS:  BP (!) 148/90 (BP Location: Right Arm, Patient Position: Sitting, Cuff Size: Large)   Pulse (!) 59   Ht 5\' 5"  (1.651 m)   Wt 205 lb (93 kg)   SpO2 97%   BMI 34.11 kg/m     Wt Readings from Last 3 Encounters:  09/30/17 205 lb (93 kg)  08/19/17 203 lb (92.1 kg)  07/30/17 202 lb 1.6 oz (91.7 kg)      GEN:  Well nourished, well developed in no acute distress HEENT: Normal NECK: No JVD; No carotid bruits LYMPHATICS: No lymphadenopathy CARDIAC: RRR, no murmurs, rubs, gallops RESPIRATORY:  Clear to auscultation without rales, wheezing or rhonchi  ABDOMEN: Soft, non-tender, non-distended MUSCULOSKELETAL:  No edema; No deformity  SKIN: Warm and dry NEUROLOGIC:  Alert and oriented x 3 PSYCHIATRIC:  Normal affect    Signed, Shirlee More, MD  09/30/2017 9:06 AM    [place]

## 2017-09-30 ENCOUNTER — Encounter: Payer: Self-pay | Admitting: Cardiology

## 2017-09-30 ENCOUNTER — Ambulatory Visit (INDEPENDENT_AMBULATORY_CARE_PROVIDER_SITE_OTHER): Payer: PPO | Admitting: Cardiology

## 2017-09-30 VITALS — BP 148/90 | HR 59 | Ht 65.0 in | Wt 205.0 lb

## 2017-09-30 DIAGNOSIS — I10 Essential (primary) hypertension: Secondary | ICD-10-CM

## 2017-09-30 DIAGNOSIS — E785 Hyperlipidemia, unspecified: Secondary | ICD-10-CM

## 2017-09-30 DIAGNOSIS — R002 Palpitations: Secondary | ICD-10-CM | POA: Diagnosis not present

## 2017-09-30 DIAGNOSIS — I209 Angina pectoris, unspecified: Secondary | ICD-10-CM | POA: Diagnosis not present

## 2017-09-30 MED ORDER — METOPROLOL SUCCINATE ER 25 MG PO TB24: 25.0000 mg | ORAL_TABLET | Freq: Every day | ORAL | Status: DC

## 2017-09-30 NOTE — Patient Instructions (Addendum)
Medication Instructions:  Your physician has recommended you make the following change in your medication:   RESTART metoprolol succinate (toprol XL) 25 mg daily    Labwork: Your physician recommends that you return for lab work in 3-7 days before cardiac CTA: BMP. You can return to our office, no appointment needed, no need to fast beforehand. We are open M-F 8-5.    Testing/Procedures: You had an EKG today.   Your physician has requested that you have cardiac CT. Cardiac computed tomography (CT) is a painless test that uses an x-ray machine to take clear, detailed pictures of your heart. For further information please visit [facility]uge[employer]iesta[employer]tn. Please follow instruction sheet as given.   Please arrive at the [facility] main entrance of [facility] at xx:xx AM (30-45 minutes prior to test start time)  [facility] [place], [place] [address] [phone]  Proceed to the [facility] Radiology Department (First Floor).  Please follow these instructions carefully (unless otherwise directed):   On the Night Before the Test: . Drink plenty of water. . Do not consume any caffeinated/decaffeinated beverages or chocolate 12 hours prior to your test. . Do not take any antihistamines 12 hours prior to your test.  On the Day of the Test: . Drink plenty of water. Do not drink any water within one hour of the test. . Do not eat any food 4 hours prior to the test. . You may take your regular medications prior to the test. . TAKE your metoprolol succinate (toprol XL) 25 mg one hour before the test.  After the Test: . Drink plenty of water. . After receiving IV contrast, you may experience a mild flushed feeling. This is normal. . On occasion, you may experience a mild rash up to 24 hours after the test. This is not dangerous. If this occurs, you can take Benadryl 25 mg and increase your fluid intake. . If you experience trouble breathing, this can be  serious. If it is severe call 911 IMMEDIATELY. If it is mild, please call our office.   Follow-Up: Your physician recommends that you schedule a follow-up appointment in: 2 months.  If you need a refill on your cardiac medications before your next appointment, please call your pharmacy.   Thank you for choosing [facility]! Robyne Peers, RN [phone]   Cardiac CT Angiogram A cardiac CT angiogram is a procedure to look at the heart and the area around the heart. It may be done to help find the cause of chest pains or other symptoms of heart disease. During this procedure, a large X-ray machine, called a CT scanner, takes detailed pictures of the heart and the surrounding area after a dye (contrast material) has been injected into blood vessels in the area. The procedure is also sometimes called a coronary CT angiogram, coronary artery scanning, or CTA. A cardiac CT angiogram allows the health care provider to see how well blood is flowing to and from the heart. The health care provider will be able to see if there are any problems, such as:  Blockage or narrowing of the coronary arteries in the heart.  Fluid around the heart.  Signs of weakness or disease in the muscles, valves, and tissues of the heart.  Tell a health care provider about:  Any allergies you have. This is especially important if you have had a previous allergic reaction to contrast dye.  All medicines you are taking, including vitamins, herbs, eye drops, creams,  and over-the-counter medicines.  Any blood disorders you have.  Any surgeries you have had.  Any medical conditions you have.  Whether you are pregnant or may be pregnant.  Any anxiety disorders, chronic pain, or other conditions you have that may increase your stress or prevent you from lying still. What are the risks? Generally, this is a safe procedure. However, problems may occur, including:  Bleeding.  Infection.  Allergic  reactions to medicines or dyes.  Damage to other structures or organs.  Kidney damage from the dye or contrast that is used.  Increased risk of cancer from radiation exposure. This risk is low. Talk with your health care provider about: ? The risks and benefits of testing. ? How you can receive the lowest dose of radiation.  What happens before the procedure?  Wear comfortable clothing and remove any jewelry, glasses, dentures, and hearing aids.  Follow instructions from your health care provider about eating and drinking. This may include: ? For 12 hours before the test - avoid caffeine. This includes tea, coffee, soda, energy drinks, and diet pills. Drink plenty of water or other fluids that do not have caffeine in them. Being well-hydrated can prevent complications. ? For 4-6 hours before the test - stop eating and drinking. The contrast dye can cause nausea, but this is less likely if your stomach is empty.  Ask your health care provider about changing or stopping your regular medicines. This is especially important if you are taking diabetes medicines, blood thinners, or medicines to treat erectile dysfunction. What happens during the procedure?  Hair on your chest may need to be removed so that small sticky patches called electrodes can be placed on your chest. These will transmit information that helps to monitor your heart during the test.  An IV tube will be inserted into one of your veins.  You might be given a medicine to control your heart rate during the test. This will help to ensure that good images are obtained.  You will be asked to lie on an exam table. This table will slide in and out of the CT machine during the procedure.  Contrast dye will be injected into the IV tube. You might feel warm, or you may get a metallic taste in your mouth.  You will be given a medicine (nitroglycerin) to relax (dilate) the arteries in your heart.  The table that you are lying on  will move into the CT machine tunnel for the scan.  The person running the machine will give you instructions while the scans are being done. You may be asked to: ? Keep your arms above your head. ? Hold your breath. ? Stay very still, even if the table is moving.  When the scanning is complete, you will be moved out of the machine.  The IV tube will be removed. The procedure may vary among health care providers and hospitals. What happens after the procedure?  You might feel warm, or you may get a metallic taste in your mouth from the contrast dye.  You may have a headache from the nitroglycerin.  After the procedure, drink water or other fluids to wash (flush) the contrast material out of your body.  Contact a health care provider if you have any symptoms of allergy to the contrast. These symptoms include: ? Shortness of breath. ? Rash or hives. ? A racing heartbeat.  Most people can return to their normal activities right after the procedure. Ask your health care  provider what activities are safe for you.  It is up to you to get the results of your procedure. Ask your health care provider, or the department that is doing the procedure, when your results will be ready. Summary  A cardiac CT angiogram is a procedure to look at the heart and the area around the heart. It may be done to help find the cause of chest pains or other symptoms of heart disease.  During this procedure, a large X-ray machine, called a CT scanner, takes detailed pictures of the heart and the surrounding area after a dye (contrast material) has been injected into blood vessels in the area.  Ask your health care provider about changing or stopping your regular medicines before the procedure. This is especially important if you are taking diabetes medicines, blood thinners, or medicines to treat erectile dysfunction.  After the procedure, drink water or other fluids to wash (flush) the contrast material out of  your body. This information is not intended to replace advice given to you by your health care provider. Make sure you discuss any questions you have with your health care provider. Document Released: 12/18/2007 Document Revised: 11/24/2015 Document Reviewed: 11/24/2015 Elsevier Interactive Patient Education  2017 [employer].

## 2017-09-30 NOTE — Addendum Note (Signed)
Addended by: Austin Miles on: 09/30/2017 09:23 AM   Modules accepted: Orders

## 2017-10-15 DIAGNOSIS — G4733 Obstructive sleep apnea (adult) (pediatric): Secondary | ICD-10-CM | POA: Diagnosis not present

## 2017-10-28 DIAGNOSIS — I10 Essential (primary) hypertension: Secondary | ICD-10-CM | POA: Diagnosis not present

## 2017-10-28 LAB — BASIC METABOLIC PANEL
BUN / CREAT RATIO: 21 (ref 12–28)
BUN: 21 mg/dL (ref 8–27)
CHLORIDE: 102 mmol/L (ref 96–106)
CO2: 25 mmol/L (ref 20–29)
Calcium: 9.6 mg/dL (ref 8.7–10.3)
Creatinine, Ser: 1.02 mg/dL — ABNORMAL HIGH (ref 0.57–1.00)
GFR calc Af Amer: 66 mL/min/{1.73_m2} (ref >59)
GFR calc non Af Amer: 57 mL/min/{1.73_m2} — ABNORMAL LOW (ref >59)
GLUCOSE: 119 mg/dL — AB (ref 65–99)
POTASSIUM: 4.5 mmol/L (ref 3.5–5.2)
Sodium: 143 mmol/L (ref 134–144)

## 2017-11-03 ENCOUNTER — Encounter: Payer: PPO | Admitting: *Deleted

## 2017-11-03 ENCOUNTER — Ambulatory Visit (HOSPITAL_COMMUNITY)
Admission: RE | Admit: 2017-11-03 | Discharge: 2017-11-03 | Disposition: A | Discharge status: Home / Self Care | Payer: PPO | Source: Ambulatory Visit | Attending: Cardiology | Admitting: Cardiology

## 2017-11-03 DIAGNOSIS — Q245 Malformation of coronary vessels: Secondary | ICD-10-CM | POA: Insufficient documentation

## 2017-11-03 DIAGNOSIS — Z006 Encounter for examination for normal comparison and control in clinical research program: Secondary | ICD-10-CM

## 2017-11-03 DIAGNOSIS — I209 Angina pectoris, unspecified: Secondary | ICD-10-CM

## 2017-11-03 MED ORDER — IOPAMIDOL (ISOVUE-370) INJECTION 76%
100.0000 mL | Freq: Once | INTRAVENOUS | Status: AC | PRN
  Administered 2017-11-03: 100 mL via INTRAVENOUS

## 2017-11-03 MED ORDER — NITROGLYCERIN 0.4 MG SL SUBL
0.8000 mg | SUBLINGUAL_TABLET | Freq: Once | SUBLINGUAL | Status: AC
  Administered 2017-11-03: 0.8 mg via SUBLINGUAL
  Filled 2017-11-03: qty 25

## 2017-11-03 MED ORDER — NITROGLYCERIN 0.4 MG SL SUBL
SUBLINGUAL_TABLET | SUBLINGUAL | Status: AC
  Filled 2017-11-03: qty 2

## 2017-11-03 NOTE — Research (Signed)
Kristen Horne meet criteria for the CADFEM trial. The trial was discussed with the patient including risks versus benefits. [name] had the opportunity to read the consent and ask questions. The consent was signed and a copy was given to her. No study related procedures were performed prior to the consent being signed at 8:25.

## 2017-11-04 ENCOUNTER — Telehealth: Payer: Self-pay

## 2017-11-04 MED ORDER — AMLODIPINE BESYLATE 5 MG PO TABS: 5.0000 mg | ORAL_TABLET | Freq: Every day | ORAL | 3 refills | Status: DC

## 2017-11-04 NOTE — Telephone Encounter (Signed)
Patient notified of good result with no blockages per Dr Bettina Gavia.  Patient advised there is a segment of one artery with external compression "bridge" that could be causing angina.  Patient is still having complaints of chest hurting and radiating to her left shoulder.  She would like to start amlodipine 5mg  one tablet daily.     Rx sent to pharmacy as requested.  Patient verbalized understanding.

## 2017-11-04 NOTE — Telephone Encounter (Signed)
-----   Message from Richardo Priest, MD sent at 11/03/2017  2:01 PM EDT ----- stable result  Good result, no blockages but has a segment of one artery with external compression  "bridge"  that can cause angina.  Is she still having a problem, if yes start amlopipine 5 mg daily # 30 refill 3, if not continue current meds

## 2017-11-10 ENCOUNTER — Other Ambulatory Visit: Payer: Self-pay | Admitting: Internal Medicine

## 2017-11-17 DIAGNOSIS — E782 Mixed hyperlipidemia: Secondary | ICD-10-CM | POA: Diagnosis not present

## 2017-11-17 DIAGNOSIS — R7303 Prediabetes: Secondary | ICD-10-CM | POA: Diagnosis not present

## 2017-11-17 DIAGNOSIS — I129 Hypertensive chronic kidney disease with stage 1 through stage 4 chronic kidney disease, or unspecified chronic kidney disease: Secondary | ICD-10-CM | POA: Diagnosis not present

## 2017-11-24 DIAGNOSIS — R7303 Prediabetes: Secondary | ICD-10-CM | POA: Diagnosis not present

## 2017-11-24 DIAGNOSIS — I129 Hypertensive chronic kidney disease with stage 1 through stage 4 chronic kidney disease, or unspecified chronic kidney disease: Secondary | ICD-10-CM | POA: Diagnosis not present

## 2017-11-24 DIAGNOSIS — Z139 Encounter for screening, unspecified: Secondary | ICD-10-CM | POA: Diagnosis not present

## 2017-11-24 DIAGNOSIS — Z23 Encounter for immunization: Secondary | ICD-10-CM | POA: Diagnosis not present

## 2017-11-24 DIAGNOSIS — Z7189 Other specified counseling: Secondary | ICD-10-CM | POA: Diagnosis not present

## 2017-11-24 DIAGNOSIS — N182 Chronic kidney disease, stage 2 (mild): Secondary | ICD-10-CM | POA: Diagnosis not present

## 2017-11-24 DIAGNOSIS — E782 Mixed hyperlipidemia: Secondary | ICD-10-CM | POA: Diagnosis not present

## 2017-11-29 DIAGNOSIS — Q245 Malformation of coronary vessels: Secondary | ICD-10-CM

## 2017-11-29 HISTORY — DX: Malformation of coronary vessels: Q24.5

## 2017-11-29 NOTE — Progress Notes (Signed)
Cardiology Office Note:    Date:  11/30/2017   ID:  Kristen Horne, DOB 1950/01/22, MRN [id number]  PCP:  Marco Collie, MD  Cardiologist:  Shirlee More, MD    Referring MD: Marco Collie, MD    ASSESSMENT:    1. Coronary-myocardial bridge   2. Essential hypertension    PLAN:    In order of problems listed above:  1. In general these are asymptomatic and non-flow-limiting his coronary blood flow is predominantly diastolic and extrinsic compression with myocardial bridges systolic.  Rarely these individuals can have angina and she has in the past.  I offered her reassurance I think her likelihood of heart attack cardiac death is very low the necessity of intervention such as surgically unroofing myocardial bridge is very infrequent she has nitroglycerin to take as needed and is switched her to a rate limiting calcium channel blocker which I think is more appropriate for relief of rare anginal discomfort.  She seems reassured and comfortable with this approach. 2. Switch to calcium channel blockers should give good blood pressure control as well   Next appointment: 6 months   Medication Adjustments/Labs and Tests Ordered: Current medicines are reviewed at length with the patient today.  Concerns regarding medicines are outlined above.  No orders of the defined types were placed in this encounter.  Meds ordered this encounter  Medications  . diltiazem (CARDIZEM CD) 180 MG 24 hr capsule    Sig: Take 1 capsule (180 mg total) by mouth daily.    Dispense:  30 capsule    Refill:  6    Chief Complaint  Patient presents with  . Follow-up    after cardiac CTAS    History of Present Illness:    Kristen Horne is a 67 y.o. female with a hx of chest pain and palpitation last seen 09/30/17. Compliance with diet, lifestyle and medications: Yes  Cardiac CTA 11/03/17: IMPRESSION: 1. Coronary artery calcium score 10 Agatston units. This places the patient in the 57th percentile for age and  gender, suggesting intermediate risk for future cardiac events. 2. No obstructive coronary disease. There is a segment of LAD myocardial bridging involving the proximal-mid LAD.  At the opportunity to meet with Alayha as well as her husband to review the results of her cardiac CTA.  Generally these are asymptomatic incidental findings rarely these people can have angina and she has had atypical nonexertional angina she has had no recent recurrence.  She will use nitroglycerin if needed no restriction activities and switch to a rate limiting calcium channel blocker that I think is more efficacious.  She said no shortness of breath palpitation or syncope. Past Medical History:  Diagnosis Date  . Arthritis   . Hyperlipemia   . Hypertension     Past Surgical History:  Procedure Laterality Date  . ABDOMINAL HYSTERECTOMY  1999  . BREAST REDUCTION SURGERY    . TUMOR REMOVAL      Current Medications: Current Meds  Medication Sig  . amLODipine (NORVASC) 5 MG tablet Take 1 tablet (5 mg total) by mouth daily.  Marland Kitchen aspirin EC 81 MG tablet Take 81 mg by mouth daily.  Marland Kitchen atorvastatin (LIPITOR) 20 MG tablet Take 20 mg by mouth at bedtime.   . citalopram (CELEXA) 40 MG tablet Take 40 mg by mouth at bedtime.   . meloxicam (MOBIC) 15 MG tablet Take 15 mg by mouth at bedtime.   . nitroGLYCERIN (NITROSTAT) 0.4 MG SL tablet Place 1 tablet  under the tongue every 5 (five) minutes as needed.  . [DISCONTINUED] metoprolol succinate (TOPROL-XL) 25 MG 24 hr tablet Take 1 tablet (25 mg total) by mouth daily.     Allergies:   Patient has no known allergies.   Social History   Socioeconomic History  . Marital status: Married    Spouse name: Not on file  . Number of children: Not on file  . Years of education: Not on file  . Highest education level: Not on file  Occupational History  . Not on file  Social Needs  . Financial resource strain: Not on file  . Food insecurity:    Worry: Not on file    Inability:  Not on file  . Transportation needs:    Medical: Not on file    Non-medical: Not on file  Tobacco Use  . Smoking status: Never Smoker  . Smokeless tobacco: Never Used  Substance and Sexual Activity  . Alcohol use: Not Currently  . Drug use: Never  . Sexual activity: Not on file  Lifestyle  . Physical activity:    Days per week: Not on file    Minutes per session: Not on file  . Stress: Not on file  Relationships  . Social connections:    Talks on phone: Not on file    Gets together: Not on file    Attends religious service: Not on file    Active member of club or organization: Not on file    Attends meetings of clubs or organizations: Not on file    Relationship status: Not on file  Other Topics Concern  . Not on file  Social History Narrative  . Not on file     Family History: The patient's family history includes Atrial fibrillation in her mother; Coronary artery disease in her father. ROS:   Please see the history of present illness.    All other systems reviewed and are negative.  EKGs/Labs/Other Studies Reviewed:    The following studies were reviewed today:   Recent Labs: 07/29/2017: Hemoglobin 15.2; Platelets 216; TSH 1.021 10/28/2017: BUN 21; Creatinine, Ser 1.02; Potassium 4.5; Sodium 143  Recent Lipid Panel    Component Value Date/Time   CHOL 155 07/30/2017 0433   TRIG 60 07/30/2017 0433   HDL 49 07/30/2017 0433   CHOLHDL 3.2 07/30/2017 0433   VLDL 12 07/30/2017 0433   LDLCALC 94 07/30/2017 0433    Physical Exam:    VS:  BP 134/86 (BP Location: Right Arm, Patient Position: Sitting, Cuff Size: Large)   Pulse 60   Ht 5\' 5"  (1.651 m)   Wt 212 lb 3.2 oz (96.3 kg)   SpO2 97%   BMI 35.31 kg/m     Wt Readings from Last 3 Encounters:  11/30/17 212 lb 3.2 oz (96.3 kg)  09/30/17 205 lb (93 kg)  08/19/17 203 lb (92.1 kg)     GEN:  Well nourished, well developed in no acute distress HEENT: Normal NECK: No JVD; No carotid bruits LYMPHATICS: No  lymphadenopathy CARDIAC: RRR, no murmurs, rubs, gallops RESPIRATORY:  Clear to auscultation without rales, wheezing or rhonchi  ABDOMEN: Soft, non-tender, non-distended MUSCULOSKELETAL:  No edema; No deformity  SKIN: Warm and dry NEUROLOGIC:  Alert and oriented x 3 PSYCHIATRIC:  Normal affect    Signed, Shirlee More, MD  11/30/2017 11:54 AM    [place]

## 2017-11-30 ENCOUNTER — Encounter: Payer: Self-pay | Admitting: Cardiology

## 2017-11-30 ENCOUNTER — Ambulatory Visit (INDEPENDENT_AMBULATORY_CARE_PROVIDER_SITE_OTHER): Payer: PPO | Admitting: Cardiology

## 2017-11-30 VITALS — BP 134/86 | HR 60 | Ht 65.0 in | Wt 212.2 lb

## 2017-11-30 DIAGNOSIS — I10 Essential (primary) hypertension: Secondary | ICD-10-CM

## 2017-11-30 DIAGNOSIS — Q245 Malformation of coronary vessels: Secondary | ICD-10-CM | POA: Diagnosis not present

## 2017-11-30 MED ORDER — DILTIAZEM HCL ER COATED BEADS 180 MG PO CP24: 180.0000 mg | ORAL_CAPSULE | Freq: Every day | ORAL | 6 refills | Status: DC

## 2017-11-30 NOTE — Patient Instructions (Signed)
Medication Instructions:  Your physician has recommended you make the following change in your medication:   STOP metoprolol succinate  START diltiazem (cardizem CD) 180 mg: Take 1 capsule daily   If you need a refill on your cardiac medications before your next appointment, please call your pharmacy.   Lab work: None  If you have labs (blood work) drawn today and your tests are completely normal, you will receive your results only by: Marland Kitchen MyChart Message (if you have MyChart) OR . A paper copy in the mail If you have any lab test that is abnormal or we need to change your treatment, we will call you to review the results.  Testing/Procedures: None  Follow-Up: At [facility], you and your health needs are our priority.  As part of our continuing mission to provide you with exceptional heart care, we have created designated Provider Care Teams.  These Care Teams include your primary Cardiologist (physician) and Advanced Practice Providers (APPs -  Physician Assistants and Nurse Practitioners) who all work together to provide you with the care you need, when you need it. You will need a follow up appointment in 6 months.  Please call our office 2 months in advance to schedule this appointment.      Diltiazem extended-release capsules or tablets What is this medicine? DILTIAZEM (dil TYE a zem) is a calcium-channel blocker. It affects the amount of calcium found in your heart and muscle cells. This relaxes your blood vessels, which can reduce the amount of work the heart has to do. This medicine is used to treat high blood pressure and chest pain caused by angina. This medicine may be used for other purposes; ask your health care provider or pharmacist if you have questions. COMMON BRAND NAME(S): Cardizem CD, Cardizem LA, Cardizem SR, Cartia XT, Dilacor XR, Dilt-CD, Diltia XT, Diltzac, Matzim LA, Rema Fendt, Tiamate, Tiazac What should I tell my health care provider before I take this  medicine? They need to know if you have any of these conditions: -heart problems, low blood pressure, irregular heartbeat -liver disease -previous heart attack -an unusual or allergic reaction to diltiazem, other medicines, foods, dyes, or preservatives -pregnant or trying to get pregnant -breast-feeding How should I use this medicine? Take this medicine by mouth with a glass of water. Follow the directions on the prescription label. Swallow whole, do not crush or chew. Ask your doctor or pharmacist if your should take this medicine with food. Take your doses at regular intervals. Do not take your medicine more often then directed. Do not stop taking except on the advice of your doctor or health care professional. Ask your doctor or health care professional how to gradually reduce the dose. Talk to your pediatrician regarding the use of this medicine in children. Special care may be needed. Overdosage: If you think you have taken too much of this medicine contact a poison control center or emergency room at once. NOTE: This medicine is only for you. Do not share this medicine with others. What if I miss a dose? If you miss a dose, take it as soon as you can. If it is almost time for your next dose, take only that dose. Do not take double or extra doses. What may interact with this medicine? Do not take this medicine with any of the following medications: -cisapride -hawthorn -pimozide -ranolazine -red yeast rice This medicine may also interact with the following medications: -buspirone -carbamazepine -cimetidine -cyclosporine -digoxin -local anesthetics or general anesthetics -  lovastatin -medicines for anxiety or difficulty sleeping like midazolam and triazolam -medicines for high blood pressure or heart problems -quinidine -rifampin, rifabutin, or rifapentine This list may not describe all possible interactions. Give your health care provider a list of all the medicines, herbs,  non-prescription drugs, or dietary supplements you use. Also tell them if you smoke, drink alcohol, or use illegal drugs. Some items may interact with your medicine. What should I watch for while using this medicine? Check your blood pressure and pulse rate regularly. Ask your doctor or health care professional what your blood pressure and pulse rate should be and when you should contact him or her. You may feel dizzy or lightheaded. Do not drive, use machinery, or do anything that needs mental alertness until you know how this medicine affects you. To reduce the risk of dizzy or fainting spells, do not sit or stand up quickly, especially if you are an older patient. Alcohol can make you more dizzy or increase flushing and rapid heartbeats. Avoid alcoholic drinks. What side effects may I notice from receiving this medicine? Side effects that you should report to your doctor or health care professional as soon as possible: -allergic reactions like skin rash, itching or hives, swelling of the face, lips, or tongue -confusion, mental depression -feeling faint or lightheaded, falls -redness, blistering, peeling or loosening of the skin, including inside the mouth -slow, irregular heartbeat -swelling of the feet and ankles -unusual bleeding or bruising, pinpoint red spots on the skin Side effects that usually do not require medical attention (report to your doctor or health care professional if they continue or are bothersome): -constipation or diarrhea -difficulty sleeping -facial flushing -headache -nausea, vomiting -sexual dysfunction -weak or tired This list may not describe all possible side effects. Call your doctor for medical advice about side effects. You may report side effects to FDA at 1-800-FDA-1088. Where should I keep my medicine? Keep out of the reach of children. Store at room temperature between 15 and 30 degrees C (59 and 86 degrees F). Protect from humidity. Throw away any  unused medicine after the expiration date. NOTE: This sheet is a summary. It may not cover all possible information. If you have questions about this medicine, talk to your doctor, pharmacist, or health care provider.  2018 Elsevier/Gold Standard (2007-04-27 14:35:47)

## 2017-12-14 DIAGNOSIS — Z1231 Encounter for screening mammogram for malignant neoplasm of breast: Secondary | ICD-10-CM | POA: Diagnosis not present

## 2017-12-14 DIAGNOSIS — Z803 Family history of malignant neoplasm of breast: Secondary | ICD-10-CM | POA: Diagnosis not present

## 2018-01-09 ENCOUNTER — Telehealth: Payer: Self-pay | Admitting: Cardiology

## 2018-01-09 NOTE — Telephone Encounter (Signed)
°  Patient states feet and legs are swelling after taking Diltiazem

## 2018-01-09 NOTE — Telephone Encounter (Signed)
During last office visit on 11/30/17, patient was started on diltiazem 180 mg daily. Patient took her first dose on 12/02/17 and started noticing bilateral lower extremity swelling on 12/24/17. The swelling has worsened in her legs and ankles and is now pitting. Patient denies shortness of breath or chest pain. Her heart rate is 60-70's and she does not keep track of her BP. Patient is taking all other medications as prescribed. Will have Dr. Bettina Gavia advise.

## 2018-01-09 NOTE — Telephone Encounter (Signed)
Patient is returning your call.  

## 2018-01-09 NOTE — Telephone Encounter (Signed)
Left message to return call 

## 2018-01-10 NOTE — Telephone Encounter (Signed)
Unfortunately edema can occur with all CCB. Best to stop and should resolve 1-2 weeks

## 2018-01-10 NOTE — Telephone Encounter (Signed)
Should she stop her amlodipine as well or just the diltiazem?

## 2018-01-10 NOTE — Telephone Encounter (Signed)
Stop amlodipine.

## 2018-01-12 NOTE — Telephone Encounter (Signed)
Patient advised to stop amlodipine and diltiazem and edema should resolve. Advised patient to buy a blood pressure machine and start monitoring her BP & HR once daily at the same time every day. Patient agreeable. Informed her to contact our office if her BP/HR are elevated and/or if edema does not resolve. Patient verbalized understanding. No further questions.

## 2018-01-12 NOTE — Addendum Note (Signed)
Addended by: Austin Miles on: 01/12/2018 08:26 AM   Modules accepted: Orders

## 2018-01-30 ENCOUNTER — Other Ambulatory Visit: Payer: Self-pay

## 2018-01-30 NOTE — Telephone Encounter (Signed)
Received Rx for amlodipine 5mg  one tablet daily, but this medication was discontinued in December.  Phoned patient and verified that she is no longer taking amlodipine and she is not.  Patient advised to continue current treatment and she verbalized understanding.

## 2018-03-13 DIAGNOSIS — E782 Mixed hyperlipidemia: Secondary | ICD-10-CM | POA: Diagnosis not present

## 2018-03-13 DIAGNOSIS — R7303 Prediabetes: Secondary | ICD-10-CM | POA: Diagnosis not present

## 2018-03-13 DIAGNOSIS — I129 Hypertensive chronic kidney disease with stage 1 through stage 4 chronic kidney disease, or unspecified chronic kidney disease: Secondary | ICD-10-CM | POA: Diagnosis not present

## 2018-03-20 DIAGNOSIS — M19031 Primary osteoarthritis, right wrist: Secondary | ICD-10-CM | POA: Diagnosis not present

## 2018-03-20 DIAGNOSIS — I201 Angina pectoris with documented spasm: Secondary | ICD-10-CM | POA: Diagnosis not present

## 2018-03-20 DIAGNOSIS — I129 Hypertensive chronic kidney disease with stage 1 through stage 4 chronic kidney disease, or unspecified chronic kidney disease: Secondary | ICD-10-CM | POA: Diagnosis not present

## 2018-03-20 DIAGNOSIS — M25531 Pain in right wrist: Secondary | ICD-10-CM | POA: Diagnosis not present

## 2018-03-21 DIAGNOSIS — M6281 Muscle weakness (generalized): Secondary | ICD-10-CM | POA: Diagnosis not present

## 2018-03-21 DIAGNOSIS — M25431 Effusion, right wrist: Secondary | ICD-10-CM | POA: Diagnosis not present

## 2018-03-21 DIAGNOSIS — M25531 Pain in right wrist: Secondary | ICD-10-CM | POA: Diagnosis not present

## 2018-03-21 DIAGNOSIS — M25541 Pain in joints of right hand: Secondary | ICD-10-CM | POA: Diagnosis not present

## 2018-03-24 DIAGNOSIS — M25431 Effusion, right wrist: Secondary | ICD-10-CM | POA: Diagnosis not present

## 2018-03-24 DIAGNOSIS — M6281 Muscle weakness (generalized): Secondary | ICD-10-CM | POA: Diagnosis not present

## 2018-03-24 DIAGNOSIS — M25531 Pain in right wrist: Secondary | ICD-10-CM | POA: Diagnosis not present

## 2018-03-24 DIAGNOSIS — M25541 Pain in joints of right hand: Secondary | ICD-10-CM | POA: Diagnosis not present

## 2018-03-27 DIAGNOSIS — M25541 Pain in joints of right hand: Secondary | ICD-10-CM | POA: Diagnosis not present

## 2018-03-27 DIAGNOSIS — M25531 Pain in right wrist: Secondary | ICD-10-CM | POA: Diagnosis not present

## 2018-03-27 DIAGNOSIS — M25431 Effusion, right wrist: Secondary | ICD-10-CM | POA: Diagnosis not present

## 2018-03-27 DIAGNOSIS — M6281 Muscle weakness (generalized): Secondary | ICD-10-CM | POA: Diagnosis not present

## 2018-03-30 DIAGNOSIS — M25531 Pain in right wrist: Secondary | ICD-10-CM | POA: Diagnosis not present

## 2018-03-30 DIAGNOSIS — M25431 Effusion, right wrist: Secondary | ICD-10-CM | POA: Diagnosis not present

## 2018-03-30 DIAGNOSIS — M6281 Muscle weakness (generalized): Secondary | ICD-10-CM | POA: Diagnosis not present

## 2018-03-30 DIAGNOSIS — M25541 Pain in joints of right hand: Secondary | ICD-10-CM | POA: Diagnosis not present

## 2018-04-03 DIAGNOSIS — M25531 Pain in right wrist: Secondary | ICD-10-CM | POA: Diagnosis not present

## 2018-04-03 DIAGNOSIS — M25431 Effusion, right wrist: Secondary | ICD-10-CM | POA: Diagnosis not present

## 2018-04-03 DIAGNOSIS — M25541 Pain in joints of right hand: Secondary | ICD-10-CM | POA: Diagnosis not present

## 2018-04-03 DIAGNOSIS — M6281 Muscle weakness (generalized): Secondary | ICD-10-CM | POA: Diagnosis not present

## 2018-04-07 DIAGNOSIS — M25541 Pain in joints of right hand: Secondary | ICD-10-CM | POA: Diagnosis not present

## 2018-04-07 DIAGNOSIS — M25531 Pain in right wrist: Secondary | ICD-10-CM | POA: Diagnosis not present

## 2018-04-07 DIAGNOSIS — M6281 Muscle weakness (generalized): Secondary | ICD-10-CM | POA: Diagnosis not present

## 2018-04-07 DIAGNOSIS — M25431 Effusion, right wrist: Secondary | ICD-10-CM | POA: Diagnosis not present

## 2018-04-10 DIAGNOSIS — M25431 Effusion, right wrist: Secondary | ICD-10-CM | POA: Diagnosis not present

## 2018-04-10 DIAGNOSIS — M25531 Pain in right wrist: Secondary | ICD-10-CM | POA: Diagnosis not present

## 2018-04-10 DIAGNOSIS — M25541 Pain in joints of right hand: Secondary | ICD-10-CM | POA: Diagnosis not present

## 2018-04-10 DIAGNOSIS — M6281 Muscle weakness (generalized): Secondary | ICD-10-CM | POA: Diagnosis not present

## 2018-04-12 DIAGNOSIS — M25531 Pain in right wrist: Secondary | ICD-10-CM | POA: Diagnosis not present

## 2018-04-12 DIAGNOSIS — M25431 Effusion, right wrist: Secondary | ICD-10-CM | POA: Diagnosis not present

## 2018-04-12 DIAGNOSIS — M25541 Pain in joints of right hand: Secondary | ICD-10-CM | POA: Diagnosis not present

## 2018-04-12 DIAGNOSIS — M6281 Muscle weakness (generalized): Secondary | ICD-10-CM | POA: Diagnosis not present

## 2018-04-17 DIAGNOSIS — M6281 Muscle weakness (generalized): Secondary | ICD-10-CM | POA: Diagnosis not present

## 2018-04-17 DIAGNOSIS — M25431 Effusion, right wrist: Secondary | ICD-10-CM | POA: Diagnosis not present

## 2018-04-17 DIAGNOSIS — M25531 Pain in right wrist: Secondary | ICD-10-CM | POA: Diagnosis not present

## 2018-04-17 DIAGNOSIS — M25541 Pain in joints of right hand: Secondary | ICD-10-CM | POA: Diagnosis not present

## 2018-04-19 DIAGNOSIS — M25531 Pain in right wrist: Secondary | ICD-10-CM | POA: Diagnosis not present

## 2018-04-19 DIAGNOSIS — K7469 Other cirrhosis of liver: Secondary | ICD-10-CM | POA: Diagnosis not present

## 2018-04-19 DIAGNOSIS — M6281 Muscle weakness (generalized): Secondary | ICD-10-CM | POA: Diagnosis not present

## 2018-04-19 DIAGNOSIS — M25541 Pain in joints of right hand: Secondary | ICD-10-CM | POA: Diagnosis not present

## 2018-04-19 DIAGNOSIS — M25431 Effusion, right wrist: Secondary | ICD-10-CM | POA: Diagnosis not present

## 2018-05-03 DIAGNOSIS — S299XXA Unspecified injury of thorax, initial encounter: Secondary | ICD-10-CM | POA: Diagnosis not present

## 2018-05-03 DIAGNOSIS — G4730 Sleep apnea, unspecified: Secondary | ICD-10-CM | POA: Diagnosis not present

## 2018-05-03 DIAGNOSIS — R195 Other fecal abnormalities: Secondary | ICD-10-CM | POA: Diagnosis not present

## 2018-05-03 DIAGNOSIS — K219 Gastro-esophageal reflux disease without esophagitis: Secondary | ICD-10-CM | POA: Diagnosis not present

## 2018-05-03 DIAGNOSIS — R0902 Hypoxemia: Secondary | ICD-10-CM | POA: Diagnosis not present

## 2018-05-03 DIAGNOSIS — G4733 Obstructive sleep apnea (adult) (pediatric): Secondary | ICD-10-CM | POA: Diagnosis not present

## 2018-05-03 DIAGNOSIS — M549 Dorsalgia, unspecified: Secondary | ICD-10-CM | POA: Diagnosis not present

## 2018-05-03 DIAGNOSIS — M545 Low back pain: Secondary | ICD-10-CM | POA: Diagnosis not present

## 2018-05-03 DIAGNOSIS — K5900 Constipation, unspecified: Secondary | ICD-10-CM | POA: Diagnosis not present

## 2018-06-20 DIAGNOSIS — I129 Hypertensive chronic kidney disease with stage 1 through stage 4 chronic kidney disease, or unspecified chronic kidney disease: Secondary | ICD-10-CM | POA: Diagnosis not present

## 2018-06-20 DIAGNOSIS — Z1331 Encounter for screening for depression: Secondary | ICD-10-CM | POA: Diagnosis not present

## 2018-06-20 DIAGNOSIS — E782 Mixed hyperlipidemia: Secondary | ICD-10-CM | POA: Diagnosis not present

## 2018-06-20 DIAGNOSIS — Z139 Encounter for screening, unspecified: Secondary | ICD-10-CM | POA: Diagnosis not present

## 2018-06-20 DIAGNOSIS — R7303 Prediabetes: Secondary | ICD-10-CM | POA: Diagnosis not present

## 2018-06-20 DIAGNOSIS — Z0000 Encounter for general adult medical examination without abnormal findings: Secondary | ICD-10-CM | POA: Diagnosis not present

## 2018-06-27 DIAGNOSIS — Z139 Encounter for screening, unspecified: Secondary | ICD-10-CM | POA: Diagnosis not present

## 2018-06-27 DIAGNOSIS — I119 Hypertensive heart disease without heart failure: Secondary | ICD-10-CM | POA: Diagnosis not present

## 2018-06-27 DIAGNOSIS — I2510 Atherosclerotic heart disease of native coronary artery without angina pectoris: Secondary | ICD-10-CM | POA: Diagnosis not present

## 2018-06-27 DIAGNOSIS — R7303 Prediabetes: Secondary | ICD-10-CM | POA: Diagnosis not present

## 2018-06-27 DIAGNOSIS — E782 Mixed hyperlipidemia: Secondary | ICD-10-CM | POA: Diagnosis not present

## 2018-10-18 DIAGNOSIS — H524 Presbyopia: Secondary | ICD-10-CM | POA: Diagnosis not present

## 2018-10-18 DIAGNOSIS — H25813 Combined forms of age-related cataract, bilateral: Secondary | ICD-10-CM | POA: Diagnosis not present

## 2018-10-20 DIAGNOSIS — E782 Mixed hyperlipidemia: Secondary | ICD-10-CM | POA: Diagnosis not present

## 2018-10-20 DIAGNOSIS — I129 Hypertensive chronic kidney disease with stage 1 through stage 4 chronic kidney disease, or unspecified chronic kidney disease: Secondary | ICD-10-CM | POA: Diagnosis not present

## 2018-10-20 DIAGNOSIS — R7303 Prediabetes: Secondary | ICD-10-CM | POA: Diagnosis not present

## 2018-10-27 DIAGNOSIS — Z23 Encounter for immunization: Secondary | ICD-10-CM | POA: Diagnosis not present

## 2018-10-27 DIAGNOSIS — N182 Chronic kidney disease, stage 2 (mild): Secondary | ICD-10-CM | POA: Diagnosis not present

## 2018-10-27 DIAGNOSIS — I129 Hypertensive chronic kidney disease with stage 1 through stage 4 chronic kidney disease, or unspecified chronic kidney disease: Secondary | ICD-10-CM | POA: Diagnosis not present

## 2018-10-27 DIAGNOSIS — E782 Mixed hyperlipidemia: Secondary | ICD-10-CM | POA: Diagnosis not present

## 2018-10-27 DIAGNOSIS — R7301 Impaired fasting glucose: Secondary | ICD-10-CM | POA: Diagnosis not present

## 2019-01-22 DIAGNOSIS — R928 Other abnormal and inconclusive findings on diagnostic imaging of breast: Secondary | ICD-10-CM | POA: Diagnosis not present

## 2019-01-22 DIAGNOSIS — N6321 Unspecified lump in the left breast, upper outer quadrant: Secondary | ICD-10-CM | POA: Diagnosis not present

## 2019-01-24 ENCOUNTER — Other Ambulatory Visit: Payer: Self-pay | Admitting: Radiology

## 2019-01-24 DIAGNOSIS — N6342 Unspecified lump in left breast, subareolar: Secondary | ICD-10-CM | POA: Diagnosis not present

## 2019-01-24 DIAGNOSIS — N641 Fat necrosis of breast: Secondary | ICD-10-CM | POA: Diagnosis not present

## 2019-02-08 DIAGNOSIS — I201 Angina pectoris with documented spasm: Secondary | ICD-10-CM | POA: Diagnosis not present

## 2019-02-08 DIAGNOSIS — M7582 Other shoulder lesions, left shoulder: Secondary | ICD-10-CM | POA: Diagnosis not present

## 2019-02-08 DIAGNOSIS — Z6835 Body mass index (BMI) 35.0-35.9, adult: Secondary | ICD-10-CM | POA: Diagnosis not present

## 2019-02-08 DIAGNOSIS — N63 Unspecified lump in unspecified breast: Secondary | ICD-10-CM | POA: Diagnosis not present

## 2019-02-12 DIAGNOSIS — M25512 Pain in left shoulder: Secondary | ICD-10-CM | POA: Diagnosis not present

## 2019-02-12 DIAGNOSIS — M25612 Stiffness of left shoulder, not elsewhere classified: Secondary | ICD-10-CM | POA: Diagnosis not present

## 2019-02-14 DIAGNOSIS — M25612 Stiffness of left shoulder, not elsewhere classified: Secondary | ICD-10-CM | POA: Diagnosis not present

## 2019-02-14 DIAGNOSIS — S46012D Strain of muscle(s) and tendon(s) of the rotator cuff of left shoulder, subsequent encounter: Secondary | ICD-10-CM | POA: Diagnosis not present

## 2019-02-14 DIAGNOSIS — M25512 Pain in left shoulder: Secondary | ICD-10-CM | POA: Diagnosis not present

## 2019-02-14 DIAGNOSIS — R531 Weakness: Secondary | ICD-10-CM | POA: Diagnosis not present

## 2019-02-19 DIAGNOSIS — S46012D Strain of muscle(s) and tendon(s) of the rotator cuff of left shoulder, subsequent encounter: Secondary | ICD-10-CM | POA: Diagnosis not present

## 2019-02-19 DIAGNOSIS — M25512 Pain in left shoulder: Secondary | ICD-10-CM | POA: Diagnosis not present

## 2019-02-19 DIAGNOSIS — R531 Weakness: Secondary | ICD-10-CM | POA: Diagnosis not present

## 2019-02-19 DIAGNOSIS — M25612 Stiffness of left shoulder, not elsewhere classified: Secondary | ICD-10-CM | POA: Diagnosis not present

## 2019-02-22 DIAGNOSIS — R531 Weakness: Secondary | ICD-10-CM | POA: Diagnosis not present

## 2019-02-22 DIAGNOSIS — S46012D Strain of muscle(s) and tendon(s) of the rotator cuff of left shoulder, subsequent encounter: Secondary | ICD-10-CM | POA: Diagnosis not present

## 2019-02-22 DIAGNOSIS — M25612 Stiffness of left shoulder, not elsewhere classified: Secondary | ICD-10-CM | POA: Diagnosis not present

## 2019-02-22 DIAGNOSIS — M25512 Pain in left shoulder: Secondary | ICD-10-CM | POA: Diagnosis not present

## 2019-02-26 ENCOUNTER — Encounter: Payer: Self-pay | Admitting: Gastroenterology

## 2019-02-27 DIAGNOSIS — M25512 Pain in left shoulder: Secondary | ICD-10-CM | POA: Diagnosis not present

## 2019-02-27 DIAGNOSIS — M25612 Stiffness of left shoulder, not elsewhere classified: Secondary | ICD-10-CM | POA: Diagnosis not present

## 2019-02-27 DIAGNOSIS — S46012D Strain of muscle(s) and tendon(s) of the rotator cuff of left shoulder, subsequent encounter: Secondary | ICD-10-CM | POA: Diagnosis not present

## 2019-02-27 DIAGNOSIS — R531 Weakness: Secondary | ICD-10-CM | POA: Diagnosis not present

## 2019-03-01 DIAGNOSIS — M25612 Stiffness of left shoulder, not elsewhere classified: Secondary | ICD-10-CM | POA: Diagnosis not present

## 2019-03-01 DIAGNOSIS — S46012D Strain of muscle(s) and tendon(s) of the rotator cuff of left shoulder, subsequent encounter: Secondary | ICD-10-CM | POA: Diagnosis not present

## 2019-03-01 DIAGNOSIS — R531 Weakness: Secondary | ICD-10-CM | POA: Diagnosis not present

## 2019-03-01 DIAGNOSIS — M25512 Pain in left shoulder: Secondary | ICD-10-CM | POA: Diagnosis not present

## 2019-03-02 DIAGNOSIS — E782 Mixed hyperlipidemia: Secondary | ICD-10-CM | POA: Diagnosis not present

## 2019-03-02 DIAGNOSIS — R7303 Prediabetes: Secondary | ICD-10-CM | POA: Diagnosis not present

## 2019-03-06 DIAGNOSIS — S46012D Strain of muscle(s) and tendon(s) of the rotator cuff of left shoulder, subsequent encounter: Secondary | ICD-10-CM | POA: Diagnosis not present

## 2019-03-06 DIAGNOSIS — M25512 Pain in left shoulder: Secondary | ICD-10-CM | POA: Diagnosis not present

## 2019-03-06 DIAGNOSIS — M25612 Stiffness of left shoulder, not elsewhere classified: Secondary | ICD-10-CM | POA: Diagnosis not present

## 2019-03-06 DIAGNOSIS — R531 Weakness: Secondary | ICD-10-CM | POA: Diagnosis not present

## 2019-03-09 DIAGNOSIS — I129 Hypertensive chronic kidney disease with stage 1 through stage 4 chronic kidney disease, or unspecified chronic kidney disease: Secondary | ICD-10-CM | POA: Diagnosis not present

## 2019-03-09 DIAGNOSIS — E782 Mixed hyperlipidemia: Secondary | ICD-10-CM | POA: Diagnosis not present

## 2019-03-09 DIAGNOSIS — N182 Chronic kidney disease, stage 2 (mild): Secondary | ICD-10-CM | POA: Diagnosis not present

## 2019-03-09 DIAGNOSIS — R7303 Prediabetes: Secondary | ICD-10-CM | POA: Diagnosis not present

## 2019-03-13 DIAGNOSIS — M25512 Pain in left shoulder: Secondary | ICD-10-CM | POA: Diagnosis not present

## 2019-03-13 DIAGNOSIS — R531 Weakness: Secondary | ICD-10-CM | POA: Diagnosis not present

## 2019-03-13 DIAGNOSIS — M25612 Stiffness of left shoulder, not elsewhere classified: Secondary | ICD-10-CM | POA: Diagnosis not present

## 2019-03-13 DIAGNOSIS — S46012D Strain of muscle(s) and tendon(s) of the rotator cuff of left shoulder, subsequent encounter: Secondary | ICD-10-CM | POA: Diagnosis not present

## 2019-03-15 DIAGNOSIS — R531 Weakness: Secondary | ICD-10-CM | POA: Diagnosis not present

## 2019-03-15 DIAGNOSIS — M25612 Stiffness of left shoulder, not elsewhere classified: Secondary | ICD-10-CM | POA: Diagnosis not present

## 2019-03-15 DIAGNOSIS — M25512 Pain in left shoulder: Secondary | ICD-10-CM | POA: Diagnosis not present

## 2019-03-15 DIAGNOSIS — S46012D Strain of muscle(s) and tendon(s) of the rotator cuff of left shoulder, subsequent encounter: Secondary | ICD-10-CM | POA: Diagnosis not present

## 2019-03-19 ENCOUNTER — Other Ambulatory Visit: Payer: Self-pay

## 2019-03-19 ENCOUNTER — Ambulatory Visit (AMBULATORY_SURGERY_CENTER): Payer: Self-pay | Admitting: *Deleted

## 2019-03-19 VITALS — Temp 97.3°F | Ht 64.0 in | Wt 207.0 lb

## 2019-03-19 DIAGNOSIS — Z01818 Encounter for other preprocedural examination: Secondary | ICD-10-CM

## 2019-03-19 DIAGNOSIS — Z1211 Encounter for screening for malignant neoplasm of colon: Secondary | ICD-10-CM

## 2019-03-19 NOTE — Progress Notes (Signed)
Patient is here in-person for PV. Patient denies any allergies to eggs or soy. Patient denies any problems with anesthesia/sedation. Patient denies any oxygen use at home. Patient denies taking any diet/weight loss medications or blood thinners. Patient is not being treated for MRSA or C-diff. EMMI education assisgned to the patient for the procedure, this was explained and instructions given to patient. COVID-19 screening test is on 3/10, the pt is aware. Pt is aware that care partner will wait in the car during procedure; if they feel like they will be too hot or cold to wait in the car; they may wait in the 4 th floor lobby. Patient is aware to bring only one care partner. We want them to wear a mask (we do not have any that we can provide them), practice social distancing, and we will check their temperatures when they get here.  I did remind the patient that their care partner needs to stay in the parking lot the entire time and have a cell phone available, we will call them when the pt is ready for discharge. Patient will wear mask into building.

## 2019-03-28 ENCOUNTER — Ambulatory Visit (INDEPENDENT_AMBULATORY_CARE_PROVIDER_SITE_OTHER): Payer: PPO

## 2019-03-28 ENCOUNTER — Other Ambulatory Visit: Payer: Self-pay | Admitting: Gastroenterology

## 2019-03-28 ENCOUNTER — Encounter: Payer: Self-pay | Admitting: Gastroenterology

## 2019-03-28 DIAGNOSIS — Z1159 Encounter for screening for other viral diseases: Secondary | ICD-10-CM

## 2019-03-29 LAB — SARS CORONAVIRUS 2 (TAT 6-24 HRS): SARS Coronavirus 2: NEGATIVE

## 2019-04-02 ENCOUNTER — Encounter: Payer: Self-pay | Admitting: Gastroenterology

## 2019-04-02 ENCOUNTER — Ambulatory Visit (AMBULATORY_SURGERY_CENTER): Payer: PPO | Admitting: Gastroenterology

## 2019-04-02 ENCOUNTER — Other Ambulatory Visit: Payer: Self-pay

## 2019-04-02 VITALS — BP 128/83 | HR 60 | Temp 96.9°F | Resp 14 | Ht 64.0 in | Wt 207.0 lb

## 2019-04-02 DIAGNOSIS — N182 Chronic kidney disease, stage 2 (mild): Secondary | ICD-10-CM | POA: Diagnosis not present

## 2019-04-02 DIAGNOSIS — I129 Hypertensive chronic kidney disease with stage 1 through stage 4 chronic kidney disease, or unspecified chronic kidney disease: Secondary | ICD-10-CM | POA: Diagnosis not present

## 2019-04-02 DIAGNOSIS — G4733 Obstructive sleep apnea (adult) (pediatric): Secondary | ICD-10-CM | POA: Diagnosis not present

## 2019-04-02 DIAGNOSIS — Z1211 Encounter for screening for malignant neoplasm of colon: Secondary | ICD-10-CM | POA: Diagnosis not present

## 2019-04-02 MED ORDER — SODIUM CHLORIDE 0.9 % IV SOLN: 500.0000 mL | Freq: Once | INTRAVENOUS | Status: AC

## 2019-04-02 NOTE — Op Note (Signed)
[place] Patient Name: Kristen Horne Procedure Date: 04/02/2019 7:59 AM MRN: [id number] Endoscopist: Jackquline Denmark , MD Age: 69 Referring MD:  Date of Birth: [birth year] Gender: Female Account #: [id number] Procedure:                Colonoscopy Indications:              Screening for colorectal malignant neoplasm Medicines:                Monitored Anesthesia Care Procedure:                Pre-Anesthesia Assessment:                           - Prior to the procedure, a History and Physical                            was performed, and patient medications and                            allergies were reviewed. The patient's tolerance of                            previous anesthesia was also reviewed. The risks                            and benefits of the procedure and the sedation                            options and risks were discussed with the patient.                            All questions were answered, and informed consent                            was obtained. Prior Anticoagulants: The patient has                            taken no previous anticoagulant or antiplatelet                            agents. ASA Grade Assessment: II - A patient with                            mild systemic disease. After reviewing the risks                            and benefits, the patient was deemed in                            satisfactory condition to undergo the procedure.                           After obtaining informed consent, the colonoscope  was passed under direct vision. Throughout the                            procedure, the patient's blood pressure, pulse, and                            oxygen saturations were monitored continuously. The                            Colonoscope was introduced through the anus and                            advanced to the the cecum, identified by                            appendiceal orifice and ileocecal  valve. The                            colonoscopy was performed without difficulty. The                            patient tolerated the procedure well. The quality                            of the bowel preparation was good. The ileocecal                            valve, appendiceal orifice, and rectum were                            photographed. Scope In: 8:05:03 AM Scope Out: 8:18:43 AM Scope Withdrawal Time: 0 hours 7 minutes 29 seconds  Total Procedure Duration: 0 hours 13 minutes 40 seconds  Findings:                 A small single small-mouthed diverticulum was found                            in the sigmoid colon.                           Non-bleeding internal hemorrhoids were found during                            retroflexion. The hemorrhoids were small.                           The exam was otherwise without abnormality on                            direct and retroflexion views. The colon was highly                            redundant. Complications:            No immediate complications. Estimated Blood Loss:  Estimated blood loss: none. Impression:               - Minimal sigmoid diverticulosis.                           - Non-bleeding internal hemorrhoids.                           - The examination was otherwise normal on direct                            and retroflexion views.                           - No specimens collected. Recommendation:           - Patient has a contact number available for                            emergencies. The signs and symptoms of potential                            delayed complications were discussed with the                            patient. Return to normal activities tomorrow.                            Written discharge instructions were provided to the                            patient.                           - High fiber diet.                           - Continue present medications.                           -  Repeat colonoscopy in 10 years for screening                            purposes. Earlier, if with any new problems or if                            there is any change in family history.                           - Return to GI office PRN.                           - The findings and recommendations were discussed                            with the patient's family. Jackquline Denmark, MD 04/02/2019 8:23:10 AM This report has been signed  electronically.

## 2019-04-02 NOTE — Progress Notes (Signed)
Report given to PACU, vss 

## 2019-04-02 NOTE — Patient Instructions (Signed)
YOU HAD AN ENDOSCOPIC PROCEDURE TODAY AT [facility]:   Refer to the procedure report that was given to you for any specific questions about what was found during the examination.  If the procedure report does not answer your questions, please call your gastroenterologist to clarify.  If you requested that your care partner not be given the details of your procedure findings, then the procedure report has been included in a sealed envelope for you to review at your convenience later.  YOU SHOULD EXPECT: Some feelings of bloating in the abdomen. Passage of more gas than usual.  Walking can help get rid of the air that was put into your GI tract during the procedure and reduce the bloating. If you had a lower endoscopy (such as a colonoscopy or flexible sigmoidoscopy) you may notice spotting of blood in your stool or on the toilet paper. If you underwent a bowel prep for your procedure, you may not have a normal bowel movement for a few days.  Please Note:  You might notice some irritation and congestion in your nose or some drainage.  This is from the oxygen used during your procedure.  There is no need for concern and it should clear up in a day or so.  SYMPTOMS TO REPORT IMMEDIATELY:   Following lower endoscopy (colonoscopy or flexible sigmoidoscopy):  Excessive amounts of blood in the stool  Significant tenderness or worsening of abdominal pains  Swelling of the abdomen that is new, acute  Fever of 100F or higher   Following upper endoscopy (EGD)  Vomiting of blood or coffee ground material  New chest pain or pain under the shoulder blades  Painful or persistently difficult swallowing  New shortness of breath  Fever of 100F or higher  Black, tarry-looking stools  For urgent or emergent issues, a gastroenterologist can be reached at any hour by calling [phone]. Do not use MyChart messaging for urgent concerns.    DIET:  We do recommend a small meal at first, but  then you may proceed to your regular diet.  Drink plenty of fluids but you should avoid alcoholic beverages for 24 hours.  MEDICATIONS: Continue present medications.  Please see handouts given to you by your recovery nurse.  Follow up with Dr. Lyndel Safe in his office as needed.  ACTIVITY:  You should plan to take it easy for the rest of today and you should NOT DRIVE or use heavy machinery until tomorrow (because of the sedation medicines used during the test).    FOLLOW UP: Our staff will call the number listed on your records 48-72 hours following your procedure to check on you and address any questions or concerns that you may have regarding the information given to you following your procedure. If we do not reach you, we will leave a message.  We will attempt to reach you two times.  During this call, we will ask if you have developed any symptoms of COVID 19. If you develop any symptoms (ie: fever, flu-like symptoms, shortness of breath, cough etc.) before then, please call [phone].  If you test positive for Covid 19 in the 2 weeks post procedure, please call and report this information to [place].    If any biopsies were taken you will be contacted by phone or by letter within the next 1-3 weeks.  Please call us at [phone] if you have not heard about the biopsies in 3 weeks.   Thank you for allowing [place]  to provide for your healthcare needs today.   SIGNATURES/CONFIDENTIALITY: You and/or your care partner have signed paperwork which will be entered into your electronic medical record.  These signatures attest to the fact that that the information above on your After Visit Summary has been reviewed and is understood.  Full responsibility of the confidentiality of this discharge information lies with you and/or your care-partner.

## 2019-04-02 NOTE — Progress Notes (Signed)
Pt's states no medical or surgical changes since previsit or office visit.  JB - temp CW - vitals. 

## 2019-04-04 ENCOUNTER — Telehealth: Payer: Self-pay

## 2019-04-04 NOTE — Telephone Encounter (Signed)
  Follow up Call-  Call back number 04/02/2019  Post procedure Call Back phone  # [phone]  Permission to leave phone message Yes  Some recent data might be hidden     Patient questions:  Do you have a fever, pain , or abdominal swelling? No. Pain Score  0 *  Have you tolerated food without any problems? Yes.    Have you been able to return to your normal activities? Yes.    Do you have any questions about your discharge instructions: Diet   No. Medications  No. Follow up visit  No.  Do you have questions or concerns about your Care? No.  Actions: * If pain score is 4 or above: No action needed, pain <4.  1. Have you developed a fever since your procedure? no  2.   Have you had an respiratory symptoms (SOB or cough) since your procedure? no  3.   Have you tested positive for COVID 19 since your procedure no  4.   Have you had any family members/close contacts diagnosed with the COVID 19 since your procedure?  no   If yes to any of these questions please route to Joylene John, RN and Alphonsa Gin, Therapist, sports.

## 2019-04-09 DIAGNOSIS — Z136 Encounter for screening for cardiovascular disorders: Secondary | ICD-10-CM | POA: Diagnosis not present

## 2019-06-08 ENCOUNTER — Encounter: Payer: Self-pay | Admitting: Cardiology

## 2019-06-08 ENCOUNTER — Other Ambulatory Visit: Payer: Self-pay

## 2019-06-08 ENCOUNTER — Ambulatory Visit: Payer: PPO | Admitting: Cardiology

## 2019-06-08 VITALS — BP 156/92 | HR 82 | Ht 64.0 in | Wt 212.8 lb

## 2019-06-08 DIAGNOSIS — E785 Hyperlipidemia, unspecified: Secondary | ICD-10-CM | POA: Diagnosis not present

## 2019-06-08 DIAGNOSIS — I10 Essential (primary) hypertension: Secondary | ICD-10-CM

## 2019-06-08 DIAGNOSIS — Q245 Malformation of coronary vessels: Secondary | ICD-10-CM

## 2019-06-08 MED ORDER — CARVEDILOL 3.125 MG PO TABS: 3.1250 mg | ORAL_TABLET | Freq: Two times a day (BID) | ORAL | 3 refills | Status: DC

## 2019-06-08 NOTE — Progress Notes (Signed)
Cardiology Office Note:    Date:  06/08/2019   ID:  Kristen Horne, DOB 04-28-1950, MRN [id number]  PCP:  Marco Collie, MD  Cardiologist:  Shirlee More, MD    Referring MD: Marco Collie, MD    ASSESSMENT:    1. Coronary-myocardial bridge   2. Essential hypertension   3. Hyperlipidemia, unspecified hyperlipidemia type    PLAN:    In order of problems listed above:  1. I think the problem here is 1 of angina with myocardial bridge as well as poorly controlled hypertension and edema from calcium channel blocker.  Like a beta-blocker could be remarkably effective especially now for beta-blocker like carvedilol to shorten systolic time increase myocardial filling atrial hypertension alleviate her edema.  I would decrease her calcium channel blocker 5 mg amlodipine daily start on low-dose carvedilol 3.125 mg twice daily and asked her to let me know by the office message for my chart if she is improving 2 to 3 weeks.  If not I will increase the dose of her beta-blocker even consider alternative therapy including nitrates or ranolazine.  She will continue medical therapy including a high intensity statin lipids are at target aspirin for general cardiovascular principal taking is aspirin 81 mg daily.  She tolerates his statin without muscle pain or weakness.   Next appointment: I will plan to see her in 6 months and she will communicate with me in a few weeks   Medication Adjustments/Labs and Tests Ordered: Current medicines are reviewed at length with the patient today.  Concerns regarding medicines are outlined above.  No orders of the defined types were placed in this encounter.  No orders of the defined types were placed in this encounter.   No chief complaint on file.   History of Present Illness:    Kristen Horne is a 70 y.o. female with a hx of chest pain with a cardiac CTA showing a relatively low calcium score of 10, 57 percentile for age and gender and a myocardial bridge in the  proximal and mid LAD and no obstructive CAD.  She was last seen 11/30/2017. Compliance with diet, lifestyle and medications: Yes  Several problems 1 is with higher dose calcium channel blocker for hypertension she has developed marked calcium channel blocker edema. She has had more frequent and variable pattern of angina mostly without activity relieved with nitroglycerin cannot even tell me how often it happens pattern very so much.  It is not typical exertional angina. No shortness of breath palpitation or syncope.  Past Medical History:  Diagnosis Date  . Arthritis   . Chronic kidney disease    stage 2   . Depression   . Heart abnormality    squeezing atery disease-heart spasm  . Hyperlipemia   . Hypertension   . Post-operative nausea and vomiting   . Sleep apnea    uses CPAP    Past Surgical History:  Procedure Laterality Date  . ABDOMINAL HYSTERECTOMY  1999  . BREAST REDUCTION SURGERY    . COLONOSCOPY  06/01/2010  . KNEE ARTHROSCOPY    . TUMOR REMOVAL      Current Medications: No outpatient medications have been marked as taking for the 06/08/19 encounter (Appointment) with Richardo Priest, MD.   Current Facility-Administered Medications for the 06/08/19 encounter (Appointment) with Richardo Priest, MD  Medication  . 0.9 %  sodium chloride infusion     Allergies:   Bee venom   Social History   Socioeconomic History  .  Marital status: Married    Spouse name: Not on file  . Number of children: Not on file  . Years of education: Not on file  . Highest education level: Not on file  Occupational History  . Not on file  Tobacco Use  . Smoking status: Never Smoker  . Smokeless tobacco: Never Used  Substance and Sexual Activity  . Alcohol use: Not Currently  . Drug use: Never  . Sexual activity: Not on file  Other Topics Concern  . Not on file  Social History Narrative  . Not on file   Social Determinants of Health   Financial Resource Strain:   .  Difficulty of Paying Living Expenses:   Food Insecurity:   . Worried About Charity fundraiser in the Last Year:   . Arboriculturist in the Last Year:   Transportation Needs:   . Film/video editor (Medical):   Marland Kitchen Lack of Transportation (Non-Medical):   Physical Activity:   . Days of Exercise per Week:   . Minutes of Exercise per Session:   Stress:   . Feeling of Stress :   Social Connections:   . Frequency of Communication with Friends and Family:   . Frequency of Social Gatherings with Friends and Family:   . Attends Religious Services:   . Active Member of Clubs or Organizations:   . Attends Archivist Meetings:   Marland Kitchen Marital Status:      Family History: The patient's family history includes Atrial fibrillation in her mother; Coronary artery disease in her father. There is no history of Colon cancer, Colon polyps, Esophageal cancer, Rectal cancer, or Stomach cancer. ROS:   Please see the history of present illness.    All other systems reviewed and are negative.  EKGs/Labs/Other Studies Reviewed:    The following studies were reviewed today:  EKG:  EKG ordered today and personally reviewed.  The ekg ordered today demonstrates low voltage sinus rhythm otherwise normal EKG.  Recent Labs: 03/02/2019 cholesterol 115 HDL 49 LDL 67 triglycerides 84 A1c 5.4% creatinine normal TSH normal, all these numbers are at goal Recent Lipid Panel    Component Value Date/Time   CHOL 155 07/30/2017 0433   TRIG 60 07/30/2017 0433   HDL 49 07/30/2017 0433   CHOLHDL 3.2 07/30/2017 0433   VLDL 12 07/30/2017 0433   LDLCALC 94 07/30/2017 0433    Physical Exam:    VS:  There were no vitals taken for this visit.    Wt Readings from Last 3 Encounters:  04/02/19 207 lb (93.9 kg)  03/19/19 207 lb (93.9 kg)  11/30/17 212 lb 3.2 oz (96.3 kg)     GEN:  Well nourished, well developed in no acute distress HEENT: Normal NECK: No JVD; No carotid bruits LYMPHATICS: No  lymphadenopathy CARDIAC: RRR, no murmurs, rubs, gallops RESPIRATORY:  Clear to auscultation without rales, wheezing or rhonchi  ABDOMEN: Soft, non-tender, non-distended MUSCULOSKELETAL: She has typical bilateral lower extremity predominantly at the ankle calcium channel blocker pitting edema; No deformity  SKIN: Warm and dry NEUROLOGIC:  Alert and oriented x 3 PSYCHIATRIC:  Normal affect    Signed, Shirlee More, MD  06/08/2019 1:13 PM    [facility]

## 2019-06-08 NOTE — Patient Instructions (Signed)
Medication Instructions:  Your physician has recommended you make the following change in your medication:  START: Carvedilol 3.25 mg take one tablet by mouth twice daily.  DECREASE: Amlodipine to 5 mg take 0.5 tablet by mouth daily.  *If you need a refill on your cardiac medications before your next appointment, please call your pharmacy*   Lab Work: None If you have labs (blood work) drawn today and your tests are completely normal, you will receive your results only by: Marland Kitchen MyChart Message (if you have MyChart) OR . A paper copy in the mail If you have any lab test that is abnormal or we need to change your treatment, we will call you to review the results.   Testing/Procedures: None   Follow-Up: At [facility], you and your health needs are our priority.  As part of our continuing mission to provide you with exceptional heart care, we have created designated Provider Care Teams.  These Care Teams include your primary Cardiologist (physician) and Advanced Practice Providers (APPs -  Physician Assistants and Nurse Practitioners) who all work together to provide you with the care you need, when you need it.  We recommend signing up for the patient portal called "MyChart".  Sign up information is provided on this After Visit Summary.  MyChart is used to connect with patients for Virtual Visits (Telemedicine).  Patients are able to view lab/test results, encounter notes, upcoming appointments, etc.  Non-urgent messages can be sent to your provider as well.   To learn more about what you can do with MyChart, go to [email or web address].    Your next appointment:   6 month(s)  The format for your next appointment:   In Person  Provider:   Shirlee More, MD   Other Instructions

## 2019-06-19 DIAGNOSIS — Z01818 Encounter for other preprocedural examination: Secondary | ICD-10-CM | POA: Diagnosis not present

## 2019-06-19 DIAGNOSIS — H25811 Combined forms of age-related cataract, right eye: Secondary | ICD-10-CM | POA: Diagnosis not present

## 2019-06-19 DIAGNOSIS — H52223 Regular astigmatism, bilateral: Secondary | ICD-10-CM | POA: Diagnosis not present

## 2019-06-26 DIAGNOSIS — E785 Hyperlipidemia, unspecified: Secondary | ICD-10-CM | POA: Diagnosis not present

## 2019-06-26 DIAGNOSIS — Z7982 Long term (current) use of aspirin: Secondary | ICD-10-CM | POA: Diagnosis not present

## 2019-06-26 DIAGNOSIS — E669 Obesity, unspecified: Secondary | ICD-10-CM | POA: Diagnosis not present

## 2019-06-26 DIAGNOSIS — H52223 Regular astigmatism, bilateral: Secondary | ICD-10-CM | POA: Diagnosis not present

## 2019-06-26 DIAGNOSIS — H259 Unspecified age-related cataract: Secondary | ICD-10-CM | POA: Diagnosis not present

## 2019-06-26 DIAGNOSIS — Z79899 Other long term (current) drug therapy: Secondary | ICD-10-CM | POA: Diagnosis not present

## 2019-06-26 DIAGNOSIS — H25811 Combined forms of age-related cataract, right eye: Secondary | ICD-10-CM | POA: Diagnosis not present

## 2019-06-29 DIAGNOSIS — E782 Mixed hyperlipidemia: Secondary | ICD-10-CM | POA: Diagnosis not present

## 2019-06-29 DIAGNOSIS — R7303 Prediabetes: Secondary | ICD-10-CM | POA: Diagnosis not present

## 2019-07-09 DIAGNOSIS — I129 Hypertensive chronic kidney disease with stage 1 through stage 4 chronic kidney disease, or unspecified chronic kidney disease: Secondary | ICD-10-CM | POA: Diagnosis not present

## 2019-07-09 DIAGNOSIS — Z7189 Other specified counseling: Secondary | ICD-10-CM | POA: Diagnosis not present

## 2019-07-09 DIAGNOSIS — Z1331 Encounter for screening for depression: Secondary | ICD-10-CM | POA: Diagnosis not present

## 2019-07-09 DIAGNOSIS — Z139 Encounter for screening, unspecified: Secondary | ICD-10-CM | POA: Diagnosis not present

## 2019-07-09 DIAGNOSIS — R7303 Prediabetes: Secondary | ICD-10-CM | POA: Diagnosis not present

## 2019-07-09 DIAGNOSIS — Z1339 Encounter for screening examination for other mental health and behavioral disorders: Secondary | ICD-10-CM | POA: Diagnosis not present

## 2019-07-09 DIAGNOSIS — Z136 Encounter for screening for cardiovascular disorders: Secondary | ICD-10-CM | POA: Diagnosis not present

## 2019-07-09 DIAGNOSIS — I25119 Atherosclerotic heart disease of native coronary artery with unspecified angina pectoris: Secondary | ICD-10-CM | POA: Diagnosis not present

## 2019-07-09 DIAGNOSIS — N182 Chronic kidney disease, stage 2 (mild): Secondary | ICD-10-CM | POA: Diagnosis not present

## 2019-07-09 DIAGNOSIS — Z0000 Encounter for general adult medical examination without abnormal findings: Secondary | ICD-10-CM | POA: Diagnosis not present

## 2019-07-17 DIAGNOSIS — H25812 Combined forms of age-related cataract, left eye: Secondary | ICD-10-CM | POA: Diagnosis not present

## 2019-07-17 DIAGNOSIS — I10 Essential (primary) hypertension: Secondary | ICD-10-CM | POA: Diagnosis not present

## 2019-07-17 DIAGNOSIS — Z7982 Long term (current) use of aspirin: Secondary | ICD-10-CM | POA: Diagnosis not present

## 2019-07-17 DIAGNOSIS — H259 Unspecified age-related cataract: Secondary | ICD-10-CM | POA: Diagnosis not present

## 2019-07-17 DIAGNOSIS — Z79899 Other long term (current) drug therapy: Secondary | ICD-10-CM | POA: Diagnosis not present

## 2019-07-17 DIAGNOSIS — Z6836 Body mass index (BMI) 36.0-36.9, adult: Secondary | ICD-10-CM | POA: Diagnosis not present

## 2019-07-17 DIAGNOSIS — H52223 Regular astigmatism, bilateral: Secondary | ICD-10-CM | POA: Diagnosis not present

## 2019-07-17 DIAGNOSIS — E785 Hyperlipidemia, unspecified: Secondary | ICD-10-CM | POA: Diagnosis not present

## 2019-07-17 DIAGNOSIS — E669 Obesity, unspecified: Secondary | ICD-10-CM | POA: Diagnosis not present

## 2019-08-06 DIAGNOSIS — Z6836 Body mass index (BMI) 36.0-36.9, adult: Secondary | ICD-10-CM | POA: Diagnosis not present

## 2019-08-13 DIAGNOSIS — Z6836 Body mass index (BMI) 36.0-36.9, adult: Secondary | ICD-10-CM | POA: Diagnosis not present

## 2019-08-27 DIAGNOSIS — Z6836 Body mass index (BMI) 36.0-36.9, adult: Secondary | ICD-10-CM | POA: Diagnosis not present

## 2019-08-31 ENCOUNTER — Other Ambulatory Visit: Payer: Self-pay | Admitting: Cardiology

## 2019-09-03 DIAGNOSIS — Z6836 Body mass index (BMI) 36.0-36.9, adult: Secondary | ICD-10-CM | POA: Diagnosis not present

## 2019-09-17 DIAGNOSIS — Z6836 Body mass index (BMI) 36.0-36.9, adult: Secondary | ICD-10-CM | POA: Diagnosis not present

## 2019-09-29 IMAGING — CR DG CHEST 2V
2 series · 2 of 2 positions shown · non-contrast
Comparison: None.

CLINICAL DATA: Chest pain

EXAM:
CHEST - 2 VIEW

[chest pa]
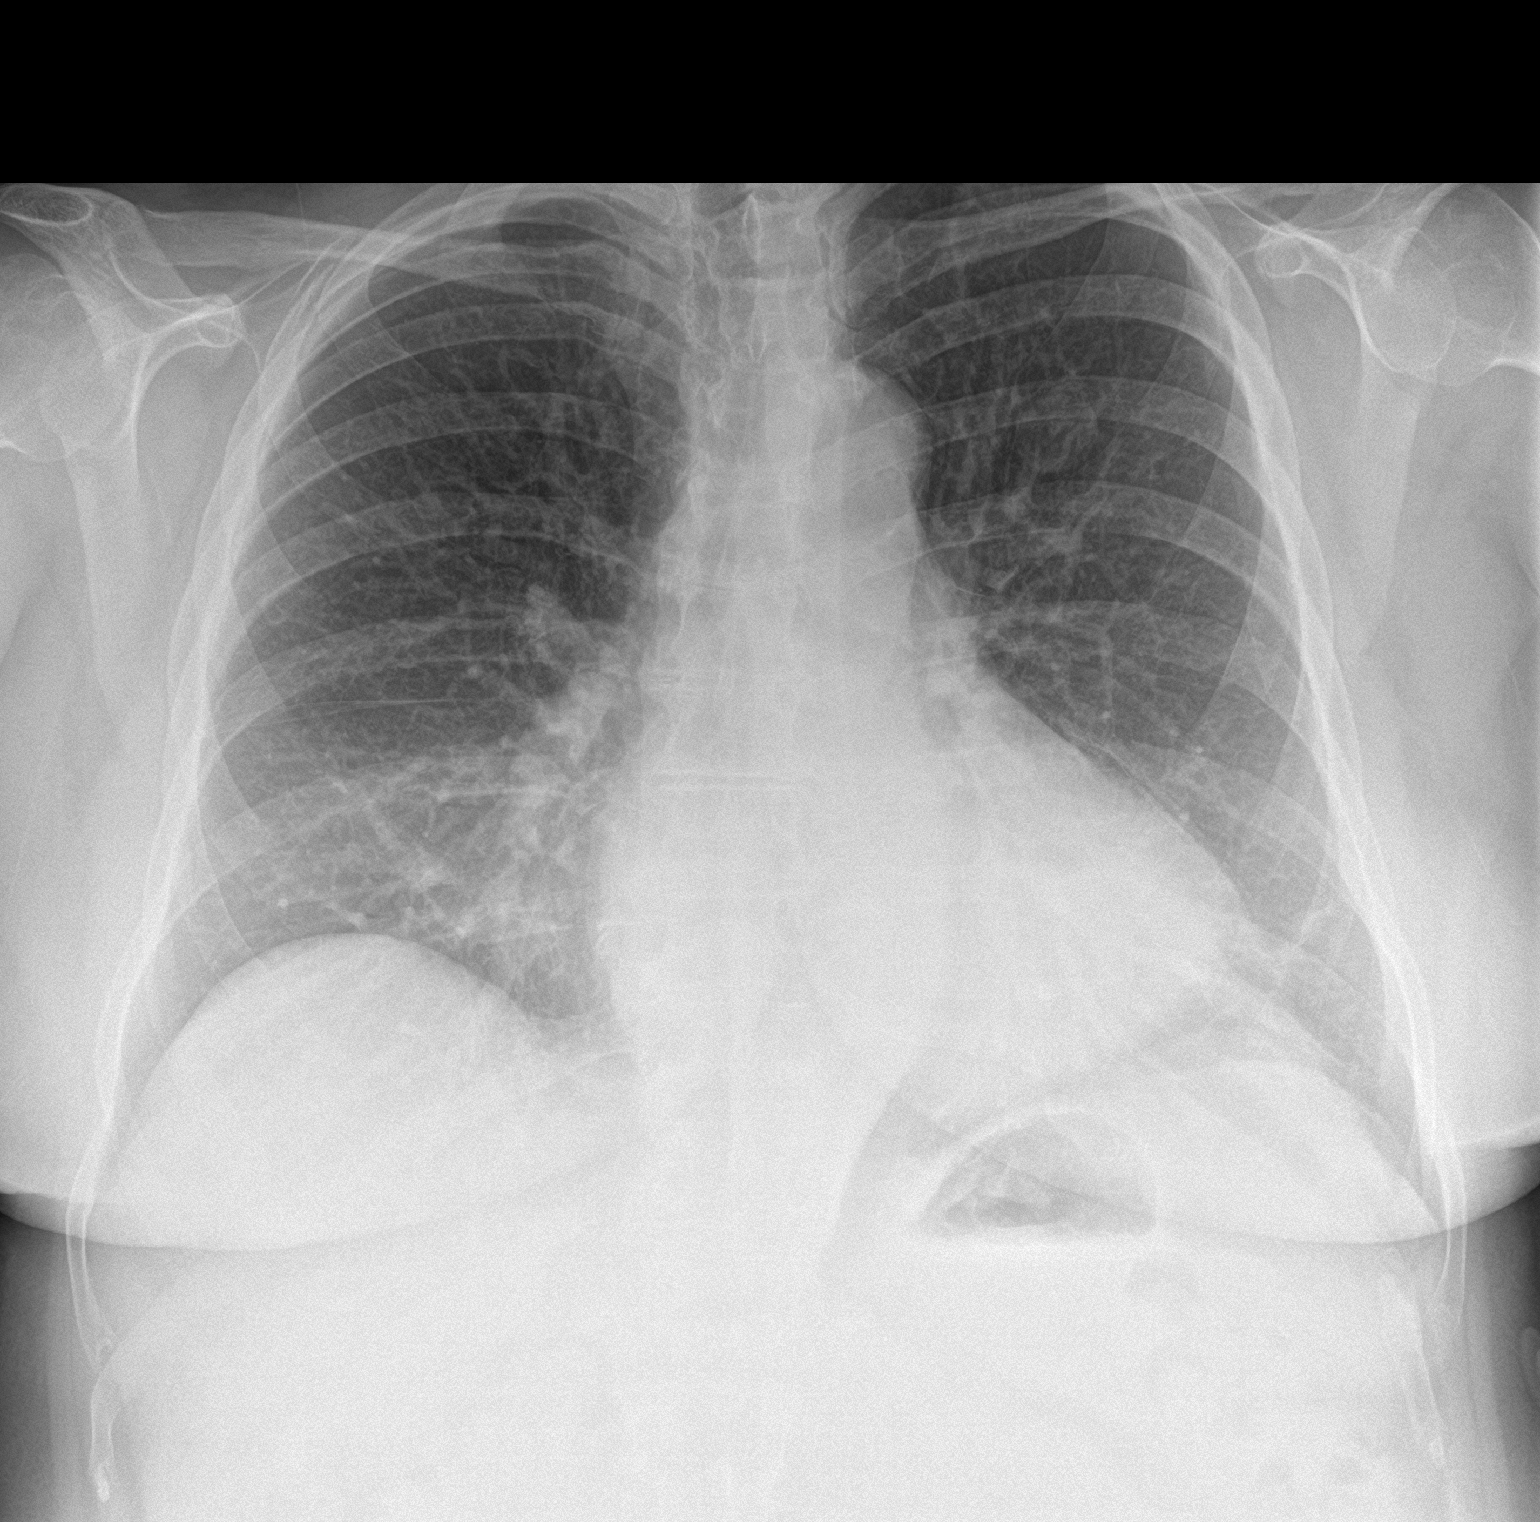

[chest lat]
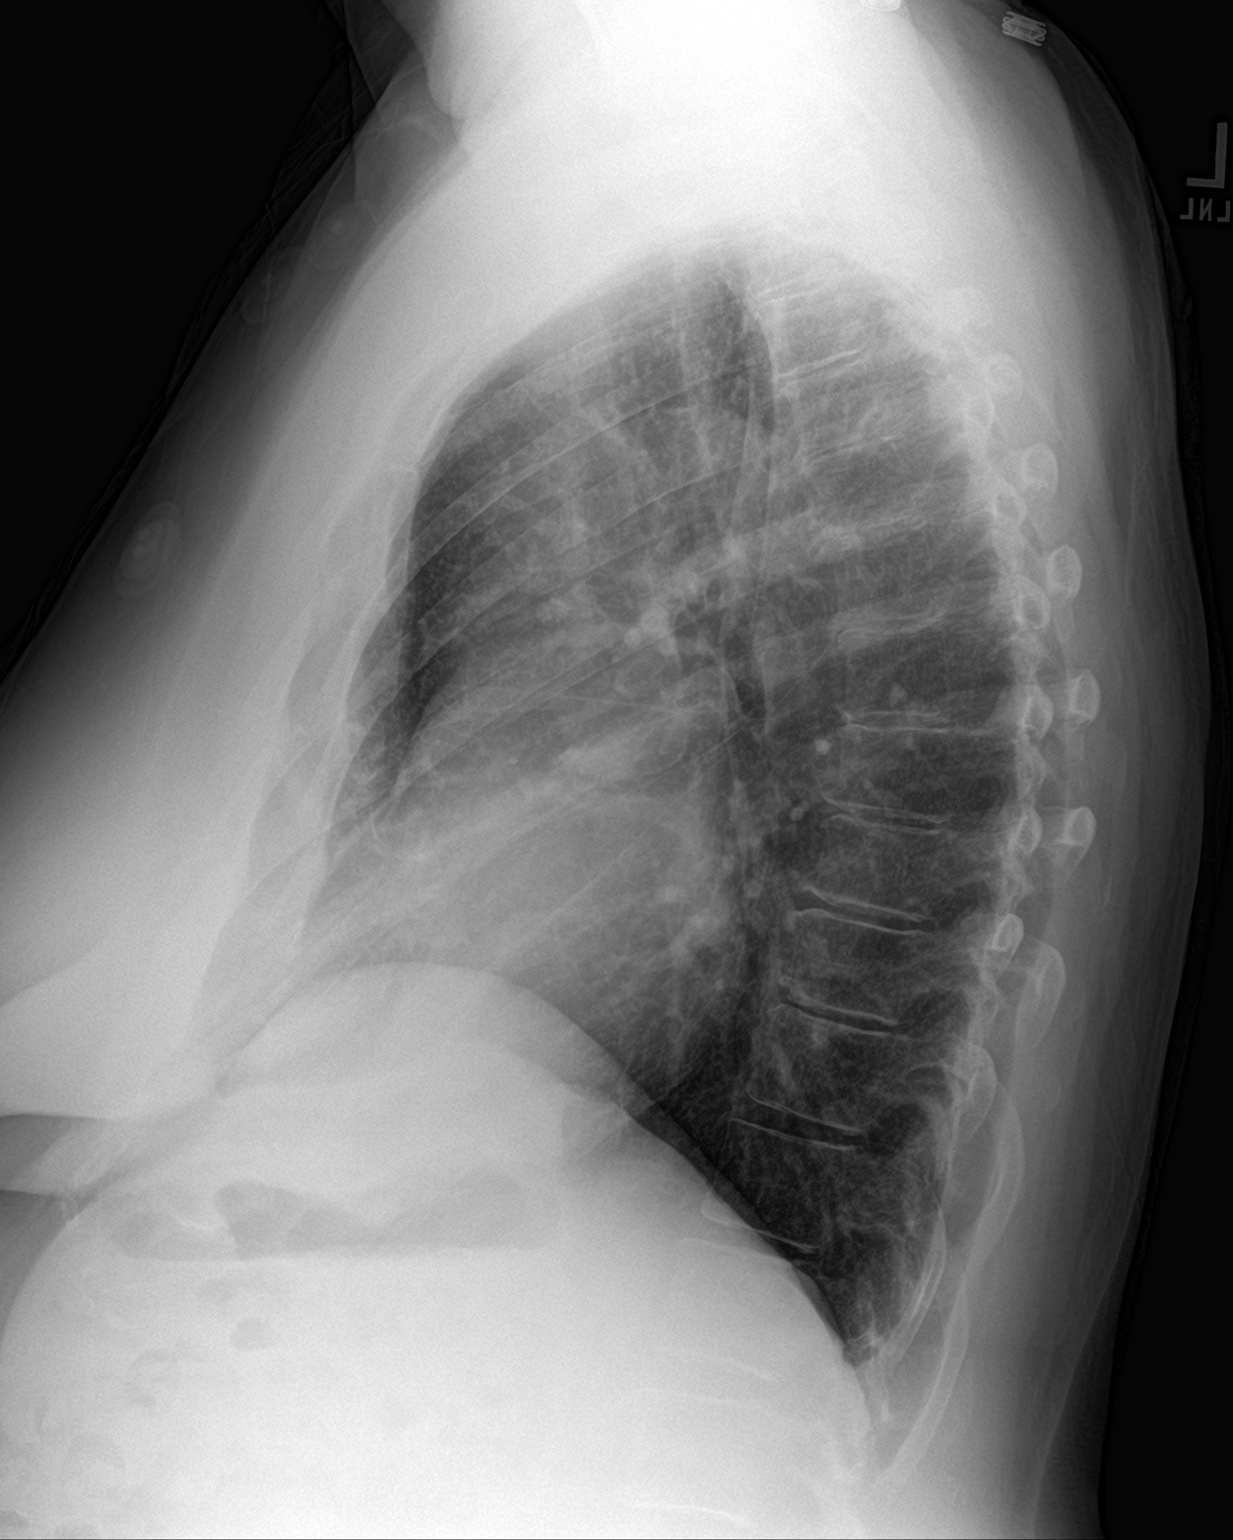

[2 of 2 positions shown; findings below may reference images not displayed]

FINDINGS: There is minimal scarring in the left base. Lungs elsewhere are
clear. Heart is mildly enlarged with pulmonary vascularity normal.
No adenopathy. No pneumothorax. No bone lesions.
IMPRESSION: Slight scarring left base. No edema or consolidation. Heart mildly
enlarged.

## 2019-10-01 DIAGNOSIS — Z6836 Body mass index (BMI) 36.0-36.9, adult: Secondary | ICD-10-CM | POA: Diagnosis not present

## 2019-10-08 DIAGNOSIS — Z6836 Body mass index (BMI) 36.0-36.9, adult: Secondary | ICD-10-CM | POA: Diagnosis not present

## 2019-10-15 DIAGNOSIS — Z6836 Body mass index (BMI) 36.0-36.9, adult: Secondary | ICD-10-CM | POA: Diagnosis not present

## 2019-10-29 DIAGNOSIS — Z6836 Body mass index (BMI) 36.0-36.9, adult: Secondary | ICD-10-CM | POA: Diagnosis not present

## 2019-11-02 DIAGNOSIS — R7303 Prediabetes: Secondary | ICD-10-CM | POA: Diagnosis not present

## 2019-11-02 DIAGNOSIS — E782 Mixed hyperlipidemia: Secondary | ICD-10-CM | POA: Diagnosis not present

## 2019-11-09 DIAGNOSIS — Z139 Encounter for screening, unspecified: Secondary | ICD-10-CM | POA: Diagnosis not present

## 2019-11-09 DIAGNOSIS — N182 Chronic kidney disease, stage 2 (mild): Secondary | ICD-10-CM | POA: Diagnosis not present

## 2019-11-09 DIAGNOSIS — Z23 Encounter for immunization: Secondary | ICD-10-CM | POA: Diagnosis not present

## 2019-11-09 DIAGNOSIS — I129 Hypertensive chronic kidney disease with stage 1 through stage 4 chronic kidney disease, or unspecified chronic kidney disease: Secondary | ICD-10-CM | POA: Diagnosis not present

## 2019-11-09 DIAGNOSIS — R7303 Prediabetes: Secondary | ICD-10-CM | POA: Diagnosis not present

## 2019-11-09 DIAGNOSIS — E782 Mixed hyperlipidemia: Secondary | ICD-10-CM | POA: Diagnosis not present

## 2019-11-13 DIAGNOSIS — Z6833 Body mass index (BMI) 33.0-33.9, adult: Secondary | ICD-10-CM | POA: Diagnosis not present

## 2019-11-27 DIAGNOSIS — E669 Obesity, unspecified: Secondary | ICD-10-CM | POA: Diagnosis not present

## 2019-11-27 DIAGNOSIS — Z6833 Body mass index (BMI) 33.0-33.9, adult: Secondary | ICD-10-CM | POA: Diagnosis not present

## 2019-12-03 DIAGNOSIS — G4730 Sleep apnea, unspecified: Secondary | ICD-10-CM | POA: Insufficient documentation

## 2019-12-03 DIAGNOSIS — Z9889 Other specified postprocedural states: Secondary | ICD-10-CM | POA: Insufficient documentation

## 2019-12-03 DIAGNOSIS — Q249 Congenital malformation of heart, unspecified: Secondary | ICD-10-CM | POA: Insufficient documentation

## 2019-12-03 DIAGNOSIS — N189 Chronic kidney disease, unspecified: Secondary | ICD-10-CM | POA: Insufficient documentation

## 2019-12-03 DIAGNOSIS — R112 Nausea with vomiting, unspecified: Secondary | ICD-10-CM | POA: Insufficient documentation

## 2019-12-03 DIAGNOSIS — E785 Hyperlipidemia, unspecified: Secondary | ICD-10-CM | POA: Insufficient documentation

## 2019-12-03 DIAGNOSIS — I10 Essential (primary) hypertension: Secondary | ICD-10-CM | POA: Insufficient documentation

## 2019-12-03 DIAGNOSIS — M1990 Unspecified osteoarthritis, unspecified site: Secondary | ICD-10-CM | POA: Insufficient documentation

## 2019-12-03 DIAGNOSIS — F32A Depression, unspecified: Secondary | ICD-10-CM | POA: Insufficient documentation

## 2019-12-05 NOTE — Progress Notes (Signed)
Cardiology Office Note:    Date:  12/06/2019   ID:  Kristen Horne, DOB 12-17-1950, MRN [id number]  PCP:  Marco Collie, MD  Cardiologist:  Shirlee More, MD    Referring MD: Marco Collie, MD    ASSESSMENT:    1. Coronary-myocardial bridge   2. Essential hypertension   3. Mixed hyperlipidemia   4. Agatston coronary artery calcium score less than 100    PLAN:    In order of problems listed above:  1. She has very mild coronary atherosclerosis and a coronary myocardial bridge.  On current medical therapy she has rare nonexertional chest pain continue aspirin beta-blocker and statin.  She has a prescription for nitroglycerin to use as needed if frequent episodes could put her on a long-acting nitrate. 2. At target continue treatment including diuretic beta-blocker 3. Lipids are ideal continue high intensity statin she did have a elevated coronary calcium score and mild plaque in her proximal LAD.   Next appointment: 1 year   Medication Adjustments/Labs and Tests Ordered: Current medicines are reviewed at length with the patient today.  Concerns regarding medicines are outlined above.  No orders of the defined types were placed in this encounter.  No orders of the defined types were placed in this encounter.   Chief Complaint  Patient presents with  . Follow-up    She has a myocardial bridge on cardiac CTA    History of Present Illness:    Kristen Horne is a 69 y.o. female with a hx of chest pain and myocardial bridge in the proximal mid LAD and mild nonobstructive CAD with calcified plaque in the left anterior descending coronary artery and calcium score of 10 on cardiac CTA last seen 06/08/2019.  She has had difficulty with edema on high-dose calcium channel blocker compliance with diet, lifestyle and medications: Yes  She has rare chest pain.  Last episode occurred after meals spicy food had very typical angina relieved with 2 nitroglycerin.  She gets substernal pressure  radiates into both arms and into her neck forces her to rest some nausea associated with it.  She is not having exertional chest pain shortness of breath palpitation or syncope.  Blood pressure in office today is at target but it was elevated with her PCP and she has a digital blood pressure device that transmits to her primary care physician's office.  Her recent labs show her lipids at target 11/02/2019 cholesterol 141 LDL 67 triglycerides 69 HDL 44 A1c is good at 5.7% creatinine normal 0.96. Past Medical History:  Diagnosis Date  . Arthritis   . Chest pain 07/29/2017  . Chronic kidney disease    stage 2   . Coronary-myocardial bridge 11/29/2017  . Depression   . Essential hypertension 08/19/2017  . Heart abnormality    squeezing atery disease-heart spasm  . Hyperlipemia   . Hyperlipidemia 08/19/2017  . Hypertension   . Post-operative nausea and vomiting   . Sleep apnea    uses CPAP    Past Surgical History:  Procedure Laterality Date  . ABDOMINAL HYSTERECTOMY  1999  . BREAST REDUCTION SURGERY    . COLONOSCOPY  06/01/2010  . KNEE ARTHROSCOPY    . TUMOR REMOVAL      Current Medications: Current Meds  Medication Sig  . amLODipine (NORVASC) 10 MG tablet Take 5 mg by mouth daily.  Marland Kitchen aspirin 81 MG chewable tablet Chew 81 mg by mouth daily.  Marland Kitchen atorvastatin (LIPITOR) 20 MG tablet Take 20 mg by  mouth at bedtime.   . carvedilol (COREG) 3.125 MG tablet TAKE 1 TABLET (3.125 MG TOTAL) BY MOUTH 2 (TWO) TIMES DAILY WITH A MEAL.  . citalopram (CELEXA) 40 MG tablet Take 40 mg by mouth at bedtime.   . furosemide (LASIX) 20 MG tablet Take 20 mg by mouth daily as needed.  . hydrochlorothiazide (HYDRODIURIL) 25 MG tablet Take 25 mg by mouth daily.  . meloxicam (MOBIC) 15 MG tablet Take 15 mg by mouth at bedtime.   . nitroGLYCERIN (NITROSTAT) 0.4 MG SL tablet Place 1 tablet under the tongue every 5 (five) minutes as needed.   Current Facility-Administered Medications for the 12/06/19 encounter  (Office Visit) with Richardo Priest, MD  Medication  . 0.9 %  sodium chloride infusion     Allergies:   Bee venom   Social History   Socioeconomic History  . Marital status: Married    Spouse name: Not on file  . Number of children: Not on file  . Years of education: Not on file  . Highest education level: Not on file  Occupational History  . Not on file  Tobacco Use  . Smoking status: Never Smoker  . Smokeless tobacco: Never Used  Vaping Use  . Vaping Use: Never used  Substance and Sexual Activity  . Alcohol use: Not Currently  . Drug use: Never  . Sexual activity: Not on file  Other Topics Concern  . Not on file  Social History Narrative  . Not on file   Social Determinants of Health   Financial Resource Strain:   . Difficulty of Paying Living Expenses: Not on file  Food Insecurity:   . Worried About Charity fundraiser in the Last Year: Not on file  . Ran Out of Food in the Last Year: Not on file  Transportation Needs:   . Lack of Transportation (Medical): Not on file  . Lack of Transportation (Non-Medical): Not on file  Physical Activity:   . Days of Exercise per Week: Not on file  . Minutes of Exercise per Session: Not on file  Stress:   . Feeling of Stress : Not on file  Social Connections:   . Frequency of Communication with Friends and Family: Not on file  . Frequency of Social Gatherings with Friends and Family: Not on file  . Attends Religious Services: Not on file  . Active Member of Clubs or Organizations: Not on file  . Attends Archivist Meetings: Not on file  . Marital Status: Not on file     Family History: The patient's family history includes Atrial fibrillation in her mother; Coronary artery disease in her father. There is no history of Colon cancer, Colon polyps, Esophageal cancer, Rectal cancer, or Stomach cancer. ROS:   Please see the history of present illness.    All other systems reviewed and are  negative.  EKGs/Labs/Other Studies Reviewed:    The following studies were reviewed today:  EKG:  EKG 06/08/2019 reviewed independently sinus rhythm low voltage otherwise normal EKG  Recent Lipid Panel    Component Value Date/Time   CHOL 155 07/30/2017 0433   TRIG 60 07/30/2017 0433   HDL 49 07/30/2017 0433   CHOLHDL 3.2 07/30/2017 0433   VLDL 12 07/30/2017 0433   LDLCALC 94 07/30/2017 0433    Physical Exam:    VS:  BP 108/72   Pulse (!) 58   Ht 5\' 4"  (1.626 m)   Wt 200 lb 9.6 oz (91 kg)  SpO2 97%   BMI 34.43 kg/m     Wt Readings from Last 3 Encounters:  12/06/19 200 lb 9.6 oz (91 kg)  06/08/19 212 lb 12.8 oz (96.5 kg)  04/02/19 207 lb (93.9 kg)     GEN:  Well nourished, well developed in no acute distress HEENT: Normal NECK: No JVD; No carotid bruits LYMPHATICS: No lymphadenopathy CARDIAC: RRR, no murmurs, rubs, gallops RESPIRATORY:  Clear to auscultation without rales, wheezing or rhonchi  ABDOMEN: Soft, non-tender, non-distended MUSCULOSKELETAL:  No edema; No deformity  SKIN: Warm and dry NEUROLOGIC:  Alert and oriented x 3 PSYCHIATRIC:  Normal affect    Signed, Shirlee More, MD  12/06/2019 9:20 AM    [place]

## 2019-12-06 ENCOUNTER — Ambulatory Visit: Payer: PPO | Admitting: Cardiology

## 2019-12-06 ENCOUNTER — Other Ambulatory Visit: Payer: Self-pay

## 2019-12-06 ENCOUNTER — Encounter: Payer: Self-pay | Admitting: Cardiology

## 2019-12-06 VITALS — BP 108/72 | HR 58 | Ht 64.0 in | Wt 200.6 lb

## 2019-12-06 DIAGNOSIS — R931 Abnormal findings on diagnostic imaging of heart and coronary circulation: Secondary | ICD-10-CM

## 2019-12-06 DIAGNOSIS — I10 Essential (primary) hypertension: Secondary | ICD-10-CM

## 2019-12-06 DIAGNOSIS — E782 Mixed hyperlipidemia: Secondary | ICD-10-CM

## 2019-12-06 DIAGNOSIS — Z6833 Body mass index (BMI) 33.0-33.9, adult: Secondary | ICD-10-CM | POA: Diagnosis not present

## 2019-12-06 DIAGNOSIS — E669 Obesity, unspecified: Secondary | ICD-10-CM | POA: Diagnosis not present

## 2019-12-06 DIAGNOSIS — Q245 Malformation of coronary vessels: Secondary | ICD-10-CM | POA: Diagnosis not present

## 2019-12-06 NOTE — Patient Instructions (Addendum)
Medication Instructions:  Your physician recommends that you continue on your current medications as directed. Please refer to the Current Medication list given to you today.  *If you need a refill on your cardiac medications before your next appointment, please call your pharmacy*   Lab Work: None If you have labs (blood work) drawn today and your tests are completely normal, you will receive your results only by: MyChart Message (if you have MyChart) OR A paper copy in the mail If you have any lab test that is abnormal or we need to change your treatment, we will call you to review the results.   Testing/Procedures: None   Follow-Up: At [facility], you and your health needs are our priority.  As part of our continuing mission to provide you with exceptional heart care, we have created designated Provider Care Teams.  These Care Teams include your primary Cardiologist (physician) and Advanced Practice Providers (APPs -  Physician Assistants and Nurse Practitioners) who all work together to provide you with the care you need, when you need it.  We recommend signing up for the patient portal called "MyChart".  Sign up information is provided on this After Visit Summary.  MyChart is used to connect with patients for Virtual Visits (Telemedicine).  Patients are able to view lab/test results, encounter notes, upcoming appointments, etc.  Non-urgent messages can be sent to your provider as well.   To learn more about what you can do with MyChart, go to [email or web address].    Your next appointment:   1 year(s)  The format for your next appointment:   In Person  Provider:   Dajion Bickford, MD   Other Instructions  Tips to measure your blood pressure correctly  To determine whether you have hypertension, a medical professional will take a blood pressure reading. How you prepare for the test, the position of your arm, and other factors can change a blood pressure reading by 10% or  more. That could be enough to hide high blood pressure, start you on a drug you don't really need, or lead your doctor to incorrectly adjust your medications. National and international guidelines offer specific instructions for measuring blood pressure. If a doctor, nurse, or medical assistant isn't doing it right, don't hesitate to ask him or her to get with the guidelines. Here's what you can do to ensure a correct reading:  Don't drink a caffeinated beverage or smoke during the 30 minutes before the test.  Sit quietly for five minutes before the test begins.  During the measurement, sit in a chair with your feet on the floor and your arm supported so your elbow is at about heart level.  The inflatable part of the cuff should completely cover at least 80% of your upper arm, and the cuff should be placed on bare skin, not over a shirt.  Don't talk during the measurement.  Have your blood pressure measured twice, with a brief break in between. If the readings are different by 5 points or more, have it done a third time. There are times to break these rules. If you sometimes feel lightheaded when getting out of bed in the morning or when you stand after sitting, you should have your blood pressure checked while seated and then while standing to see if it falls from one position to the next. Because blood pressure varies throughout the day, your doctor will rarely diagnose hypertension on the basis of a single reading. Instead, he or she will want   to confirm the measurements on at least two occasions, usually within a few weeks of one another. The exception to this rule is if you have a blood pressure reading of 180/110 mm Hg or higher. A result this high usually calls for prompt treatment. It's also a good idea to have your blood pressure measured in both arms at least once, since the reading in one arm (usually the right) may be higher than that in the left. A 2014 study in The American Journal of  Medicine of nearly 3,400 people found average arm- to-arm differences in systolic blood pressure of about 5 points. The higher number should be used to make treatment decisions. In 2017, new guidelines from the American Heart Association, the American College of Cardiology, and nine other health organizations lowered the diagnosis of high blood pressure to 130/80 mm Hg or higher for all adults. The guidelines also redefined the various blood pressure categories to now include normal, elevated, Stage 1 hypertension, Stage 2 hypertension, and hypertensive crisis (see "Blood pressure categories"). Blood pressure categories  Blood pressure category SYSTOLIC (upper number)  DIASTOLIC (lower number)  Normal Less than 120 mm Hg and Less than 80 mm Hg  Elevated 120-129 mm Hg and Less than 80 mm Hg  High blood pressure: Stage 1 hypertension 130-139 mm Hg or 80-89 mm Hg  High blood pressure: Stage 2 hypertension 140 mm Hg or higher or 90 mm Hg or higher  Hypertensive crisis (consult your doctor immediately) Higher than 180 mm Hg and/or Higher than 120 mm Hg  Source: American Heart Association and American Stroke Association. For more on getting your blood pressure under control, buy Controlling Your Blood Pressure, a Special Health Report from [employer].  

## 2019-12-11 DIAGNOSIS — E669 Obesity, unspecified: Secondary | ICD-10-CM | POA: Diagnosis not present

## 2019-12-11 DIAGNOSIS — Z6833 Body mass index (BMI) 33.0-33.9, adult: Secondary | ICD-10-CM | POA: Diagnosis not present

## 2019-12-18 DIAGNOSIS — I129 Hypertensive chronic kidney disease with stage 1 through stage 4 chronic kidney disease, or unspecified chronic kidney disease: Secondary | ICD-10-CM | POA: Diagnosis not present

## 2019-12-25 DIAGNOSIS — E669 Obesity, unspecified: Secondary | ICD-10-CM | POA: Diagnosis not present

## 2019-12-25 DIAGNOSIS — Z6833 Body mass index (BMI) 33.0-33.9, adult: Secondary | ICD-10-CM | POA: Diagnosis not present

## 2020-01-04 IMAGING — CT CT HEART MORP W/ CTA COR W/ SCORE W/ CA W/CM &/OR W/O CM
4 of 7 series · 8 of 20 positions shown, 9 images · IV contrast (APPLIED)
Comparison: None.

CLINICAL DATA: Chest pain

EXAM:
Cardiac CTA
MEDICATIONS:
Sub lingual nitro. 4mg x 2
TECHNIQUE: The patient was scanned on a Siemens [REDACTED]ice scanner. Gantry
rotation speed was 250 msecs. Collimation was 0.6 mm. A 100 kV
prospective scan was triggered in the ascending thoracic aorta at
35-75% of the R-R interval. Average HR during the scan was 60 bpm.
The 3D data set was interpreted on a dedicated work station using
MPR, MIP and VRT modes. A total of 80cc of contrast was used.

[Series 6: best diast 77 % · axial · 0.37mm/px · z∈[+1194,+1234]mm · 2 of 308 slices shown, 3 images]
[im 103/308  vessel]
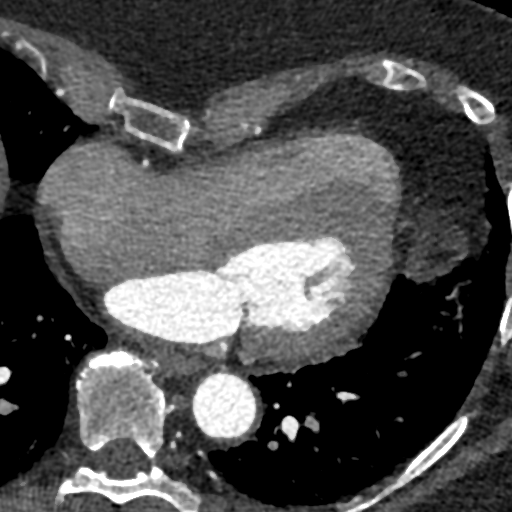
[im 103/308  lung]
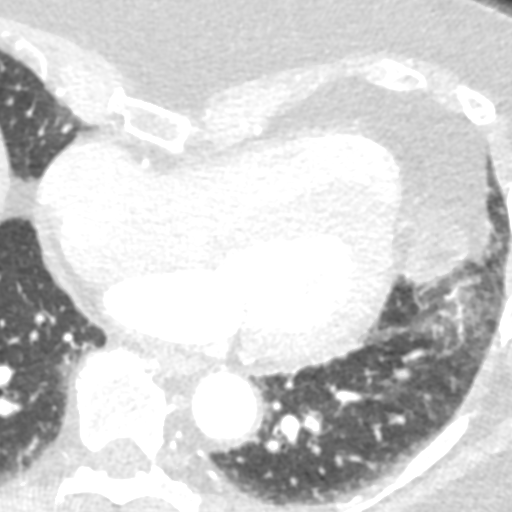
[im 205/308  vessel]
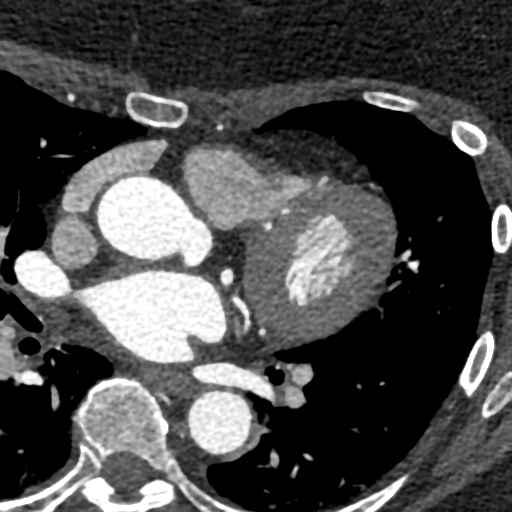

[Series 7: best syst 38 % · axial · 0.37mm/px · z∈[+1194,+1234]mm · 2 of 308 slices shown]
[im 103/308  vessel]
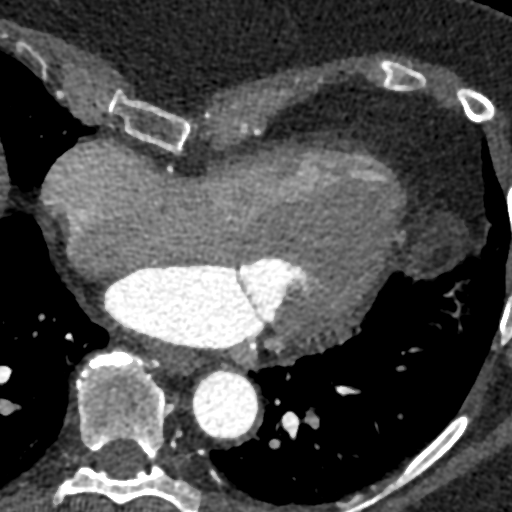
[im 205/308  vessel]
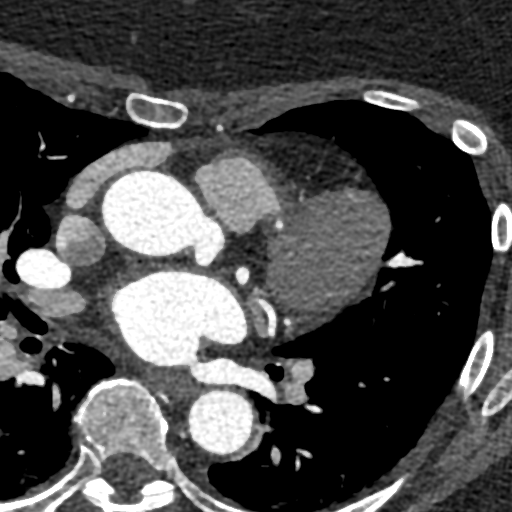

[Series 8: ts diast sharp 77 % · axial · 0.37mm/px · z∈[+1194,+1234]mm · 2 of 308 slices shown]
[im 103/308  lung]
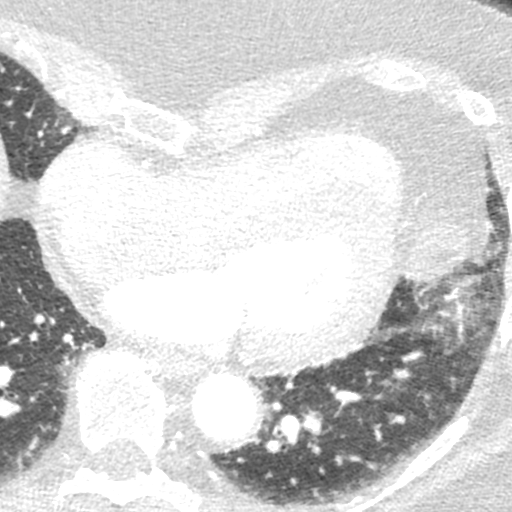
[im 205/308  lung]
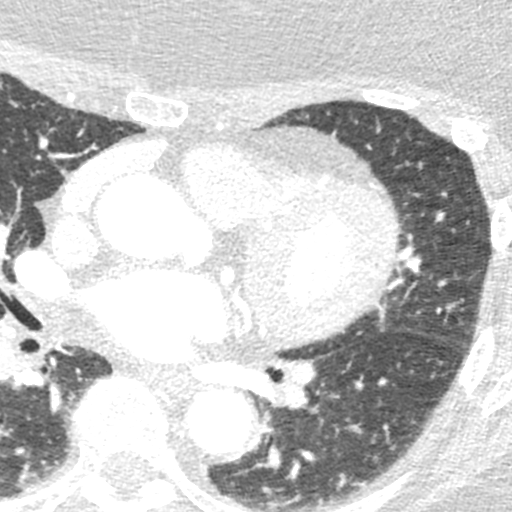

[Series 9: ts syst sharp 38 % · axial · 0.37mm/px · z∈[+1194,+1234]mm · 2 of 308 slices shown]
[im 103/308  lung]
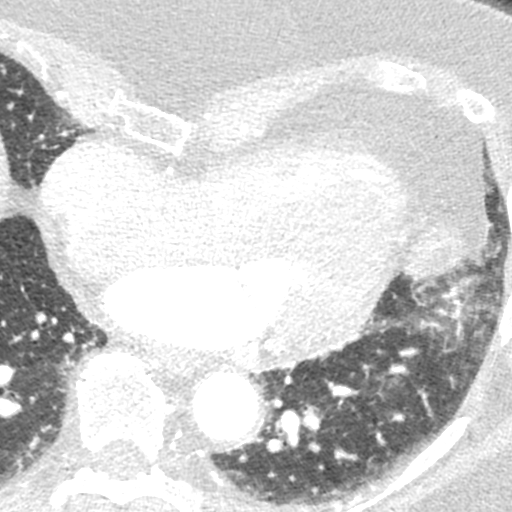
[im 205/308  lung]
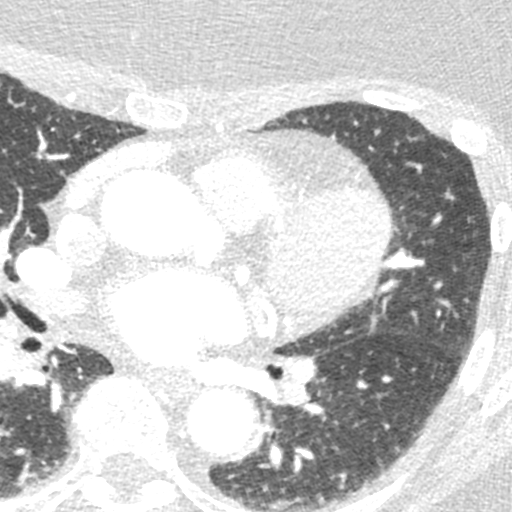

[8 of 20 positions shown; findings below may reference images not displayed]

FINDINGS: Non-cardiac: See separate report from [REDACTED].

The pulmonary veins drain normally to the left atrium.

Calcium Score: 10 Agatston units.

Coronary Arteries: Left dominant with no anomalies

LM: No plaque or stenosis.

LAD system: Calcified plaque without significant stenosis in the
proximal LAD, no other atherosclerotic disease seen in the LAD.
There is a relatively long segment of bridging myocardium in the
proximal-mid LAD.

Circumflex system: Dominant vessel providing left PDA. No plaque or
stenosis.

RCA system: Small, nondominant RCA with no significant disease.
IMPRESSION: 1. Coronary artery calcium score 10 Agatston units. This places the
patient in the 57th percentile for age and gender, suggesting
intermediate risk for future cardiac events.

2. No obstructive coronary disease. There is a segment of LAD
myocardial bridging involving the proximal-mid LAD.

Saoqho Solomone

EXAM:
OVER-READ INTERPRETATION  CT CHEST

The following report is an over-read performed by radiologist Dr.
Visente Nickelson [REDACTED] on 11/03/2017. This
over-read does not include interpretation of cardiac or coronary
anatomy or pathology. The coronary CTA interpretation by the
cardiologist is attached.
FINDINGS: Vascular: Heart is upper limits normal in size, accentuated by
pectus deformity of the sternum. Visualized aorta is normal caliber.

Mediastinum/Nodes: No adenopathy in the lower mediastinum or hila.

Lungs/Pleura: Lingular scarring. No other confluent airspace
opacities or effusions.

Upper Abdomen: Imaging into the upper abdomen shows no acute
findings.

Musculoskeletal: Chest wall soft tissues are unremarkable. Pectus
deformity of the sternum. No acute bony abnormality.
IMPRESSION: No acute or significant extracardiac abnormality.

## 2020-01-08 DIAGNOSIS — Z6833 Body mass index (BMI) 33.0-33.9, adult: Secondary | ICD-10-CM | POA: Diagnosis not present

## 2020-01-08 DIAGNOSIS — Z23 Encounter for immunization: Secondary | ICD-10-CM | POA: Diagnosis not present

## 2020-01-08 DIAGNOSIS — E669 Obesity, unspecified: Secondary | ICD-10-CM | POA: Diagnosis not present

## 2020-01-22 DIAGNOSIS — E669 Obesity, unspecified: Secondary | ICD-10-CM | POA: Diagnosis not present

## 2020-01-22 DIAGNOSIS — Z6833 Body mass index (BMI) 33.0-33.9, adult: Secondary | ICD-10-CM | POA: Diagnosis not present

## 2020-02-06 DIAGNOSIS — E669 Obesity, unspecified: Secondary | ICD-10-CM | POA: Diagnosis not present

## 2020-02-06 DIAGNOSIS — Z6833 Body mass index (BMI) 33.0-33.9, adult: Secondary | ICD-10-CM | POA: Diagnosis not present

## 2020-02-29 DIAGNOSIS — R7303 Prediabetes: Secondary | ICD-10-CM | POA: Diagnosis not present

## 2020-02-29 DIAGNOSIS — E782 Mixed hyperlipidemia: Secondary | ICD-10-CM | POA: Diagnosis not present

## 2020-03-04 DIAGNOSIS — Z20822 Contact with and (suspected) exposure to covid-19: Secondary | ICD-10-CM | POA: Diagnosis not present

## 2020-03-04 DIAGNOSIS — Z1152 Encounter for screening for COVID-19: Secondary | ICD-10-CM | POA: Diagnosis not present

## 2020-03-04 DIAGNOSIS — J029 Acute pharyngitis, unspecified: Secondary | ICD-10-CM | POA: Diagnosis not present

## 2020-03-04 DIAGNOSIS — R059 Cough, unspecified: Secondary | ICD-10-CM | POA: Diagnosis not present

## 2020-03-04 DIAGNOSIS — J020 Streptococcal pharyngitis: Secondary | ICD-10-CM | POA: Diagnosis not present

## 2020-03-07 DIAGNOSIS — J189 Pneumonia, unspecified organism: Secondary | ICD-10-CM | POA: Diagnosis not present

## 2020-03-07 DIAGNOSIS — R059 Cough, unspecified: Secondary | ICD-10-CM | POA: Diagnosis not present

## 2020-03-07 DIAGNOSIS — R7303 Prediabetes: Secondary | ICD-10-CM | POA: Diagnosis not present

## 2020-03-07 DIAGNOSIS — E860 Dehydration: Secondary | ICD-10-CM | POA: Diagnosis not present

## 2020-03-07 DIAGNOSIS — N182 Chronic kidney disease, stage 2 (mild): Secondary | ICD-10-CM | POA: Diagnosis not present

## 2020-03-07 DIAGNOSIS — I129 Hypertensive chronic kidney disease with stage 1 through stage 4 chronic kidney disease, or unspecified chronic kidney disease: Secondary | ICD-10-CM | POA: Diagnosis not present

## 2020-03-13 DIAGNOSIS — Z6833 Body mass index (BMI) 33.0-33.9, adult: Secondary | ICD-10-CM | POA: Diagnosis not present

## 2020-03-13 DIAGNOSIS — E669 Obesity, unspecified: Secondary | ICD-10-CM | POA: Diagnosis not present

## 2020-03-14 DIAGNOSIS — R059 Cough, unspecified: Secondary | ICD-10-CM | POA: Diagnosis not present

## 2020-03-14 DIAGNOSIS — Z6834 Body mass index (BMI) 34.0-34.9, adult: Secondary | ICD-10-CM | POA: Diagnosis not present

## 2020-03-14 DIAGNOSIS — R030 Elevated blood-pressure reading, without diagnosis of hypertension: Secondary | ICD-10-CM | POA: Diagnosis not present

## 2020-04-01 DIAGNOSIS — R928 Other abnormal and inconclusive findings on diagnostic imaging of breast: Secondary | ICD-10-CM | POA: Diagnosis not present

## 2020-04-03 DIAGNOSIS — Z6834 Body mass index (BMI) 34.0-34.9, adult: Secondary | ICD-10-CM | POA: Diagnosis not present

## 2020-04-03 DIAGNOSIS — E669 Obesity, unspecified: Secondary | ICD-10-CM | POA: Diagnosis not present

## 2020-05-01 DIAGNOSIS — E669 Obesity, unspecified: Secondary | ICD-10-CM | POA: Diagnosis not present

## 2020-05-01 DIAGNOSIS — Z6834 Body mass index (BMI) 34.0-34.9, adult: Secondary | ICD-10-CM | POA: Diagnosis not present

## 2020-05-13 DIAGNOSIS — D2239 Melanocytic nevi of other parts of face: Secondary | ICD-10-CM | POA: Diagnosis not present

## 2020-05-13 DIAGNOSIS — D1801 Hemangioma of skin and subcutaneous tissue: Secondary | ICD-10-CM | POA: Diagnosis not present

## 2020-05-13 DIAGNOSIS — D225 Melanocytic nevi of trunk: Secondary | ICD-10-CM | POA: Diagnosis not present

## 2020-05-13 DIAGNOSIS — L820 Inflamed seborrheic keratosis: Secondary | ICD-10-CM | POA: Diagnosis not present

## 2020-05-13 DIAGNOSIS — L814 Other melanin hyperpigmentation: Secondary | ICD-10-CM | POA: Diagnosis not present

## 2020-06-27 DIAGNOSIS — R7303 Prediabetes: Secondary | ICD-10-CM | POA: Diagnosis not present

## 2020-06-27 DIAGNOSIS — I129 Hypertensive chronic kidney disease with stage 1 through stage 4 chronic kidney disease, or unspecified chronic kidney disease: Secondary | ICD-10-CM | POA: Diagnosis not present

## 2020-07-07 DIAGNOSIS — I25119 Atherosclerotic heart disease of native coronary artery with unspecified angina pectoris: Secondary | ICD-10-CM | POA: Diagnosis not present

## 2020-07-07 DIAGNOSIS — Z0000 Encounter for general adult medical examination without abnormal findings: Secondary | ICD-10-CM | POA: Diagnosis not present

## 2020-07-07 DIAGNOSIS — E669 Obesity, unspecified: Secondary | ICD-10-CM | POA: Diagnosis not present

## 2020-07-07 DIAGNOSIS — I129 Hypertensive chronic kidney disease with stage 1 through stage 4 chronic kidney disease, or unspecified chronic kidney disease: Secondary | ICD-10-CM | POA: Diagnosis not present

## 2020-07-07 DIAGNOSIS — Z6835 Body mass index (BMI) 35.0-35.9, adult: Secondary | ICD-10-CM | POA: Diagnosis not present

## 2020-07-07 DIAGNOSIS — E782 Mixed hyperlipidemia: Secondary | ICD-10-CM | POA: Diagnosis not present

## 2020-07-07 DIAGNOSIS — Z1331 Encounter for screening for depression: Secondary | ICD-10-CM | POA: Diagnosis not present

## 2020-07-07 DIAGNOSIS — N182 Chronic kidney disease, stage 2 (mild): Secondary | ICD-10-CM | POA: Diagnosis not present

## 2020-07-07 DIAGNOSIS — Z1339 Encounter for screening examination for other mental health and behavioral disorders: Secondary | ICD-10-CM | POA: Diagnosis not present

## 2020-07-07 DIAGNOSIS — Z7189 Other specified counseling: Secondary | ICD-10-CM | POA: Diagnosis not present

## 2020-07-07 DIAGNOSIS — Z136 Encounter for screening for cardiovascular disorders: Secondary | ICD-10-CM | POA: Diagnosis not present

## 2020-07-07 DIAGNOSIS — Z139 Encounter for screening, unspecified: Secondary | ICD-10-CM | POA: Diagnosis not present

## 2020-07-15 ENCOUNTER — Other Ambulatory Visit: Payer: Self-pay | Admitting: Cardiology

## 2020-07-15 NOTE — Telephone Encounter (Signed)
Refill sent to pharmacy.   

## 2020-08-14 DIAGNOSIS — H524 Presbyopia: Secondary | ICD-10-CM | POA: Diagnosis not present

## 2020-08-18 DIAGNOSIS — Z23 Encounter for immunization: Secondary | ICD-10-CM | POA: Diagnosis not present

## 2020-08-18 DIAGNOSIS — Z0000 Encounter for general adult medical examination without abnormal findings: Secondary | ICD-10-CM | POA: Diagnosis not present

## 2020-08-18 DIAGNOSIS — Z1211 Encounter for screening for malignant neoplasm of colon: Secondary | ICD-10-CM | POA: Diagnosis not present

## 2020-08-18 DIAGNOSIS — I872 Venous insufficiency (chronic) (peripheral): Secondary | ICD-10-CM | POA: Diagnosis not present

## 2020-10-12 ENCOUNTER — Other Ambulatory Visit: Payer: Self-pay | Admitting: Cardiology

## 2020-10-24 DIAGNOSIS — H1013 Acute atopic conjunctivitis, bilateral: Secondary | ICD-10-CM | POA: Diagnosis not present

## 2020-10-24 DIAGNOSIS — Z23 Encounter for immunization: Secondary | ICD-10-CM | POA: Diagnosis not present

## 2020-11-05 DIAGNOSIS — E782 Mixed hyperlipidemia: Secondary | ICD-10-CM | POA: Diagnosis not present

## 2020-11-05 DIAGNOSIS — R7303 Prediabetes: Secondary | ICD-10-CM | POA: Diagnosis not present

## 2020-11-05 DIAGNOSIS — Z23 Encounter for immunization: Secondary | ICD-10-CM | POA: Diagnosis not present

## 2020-11-10 DIAGNOSIS — I201 Angina pectoris with documented spasm: Secondary | ICD-10-CM | POA: Diagnosis not present

## 2020-11-10 DIAGNOSIS — I129 Hypertensive chronic kidney disease with stage 1 through stage 4 chronic kidney disease, or unspecified chronic kidney disease: Secondary | ICD-10-CM | POA: Diagnosis not present

## 2020-11-10 DIAGNOSIS — I25119 Atherosclerotic heart disease of native coronary artery with unspecified angina pectoris: Secondary | ICD-10-CM | POA: Diagnosis not present

## 2020-11-10 DIAGNOSIS — N182 Chronic kidney disease, stage 2 (mild): Secondary | ICD-10-CM | POA: Diagnosis not present

## 2020-11-11 DIAGNOSIS — M9902 Segmental and somatic dysfunction of thoracic region: Secondary | ICD-10-CM | POA: Diagnosis not present

## 2020-11-11 DIAGNOSIS — M5416 Radiculopathy, lumbar region: Secondary | ICD-10-CM | POA: Diagnosis not present

## 2020-11-11 DIAGNOSIS — M9903 Segmental and somatic dysfunction of lumbar region: Secondary | ICD-10-CM | POA: Diagnosis not present

## 2020-11-11 DIAGNOSIS — M62830 Muscle spasm of back: Secondary | ICD-10-CM | POA: Diagnosis not present

## 2020-11-11 DIAGNOSIS — M9905 Segmental and somatic dysfunction of pelvic region: Secondary | ICD-10-CM | POA: Diagnosis not present

## 2020-11-17 DIAGNOSIS — S21002A Unspecified open wound of left breast, initial encounter: Secondary | ICD-10-CM | POA: Diagnosis not present

## 2020-11-17 DIAGNOSIS — Z6837 Body mass index (BMI) 37.0-37.9, adult: Secondary | ICD-10-CM | POA: Diagnosis not present

## 2020-11-24 DIAGNOSIS — Z23 Encounter for immunization: Secondary | ICD-10-CM | POA: Diagnosis not present

## 2020-12-01 DIAGNOSIS — L0100 Impetigo, unspecified: Secondary | ICD-10-CM | POA: Diagnosis not present

## 2020-12-01 DIAGNOSIS — L853 Xerosis cutis: Secondary | ICD-10-CM | POA: Diagnosis not present

## 2020-12-05 ENCOUNTER — Other Ambulatory Visit: Payer: Self-pay

## 2020-12-05 ENCOUNTER — Encounter: Payer: Self-pay | Admitting: Cardiology

## 2020-12-05 ENCOUNTER — Ambulatory Visit: Payer: PPO | Admitting: Cardiology

## 2020-12-05 VITALS — BP 134/84 | HR 71 | Ht 64.0 in | Wt 211.6 lb

## 2020-12-05 DIAGNOSIS — R931 Abnormal findings on diagnostic imaging of heart and coronary circulation: Secondary | ICD-10-CM | POA: Diagnosis not present

## 2020-12-05 DIAGNOSIS — Q245 Malformation of coronary vessels: Secondary | ICD-10-CM

## 2020-12-05 DIAGNOSIS — E782 Mixed hyperlipidemia: Secondary | ICD-10-CM | POA: Diagnosis not present

## 2020-12-05 DIAGNOSIS — Z6836 Body mass index (BMI) 36.0-36.9, adult: Secondary | ICD-10-CM | POA: Diagnosis not present

## 2020-12-05 DIAGNOSIS — S21002D Unspecified open wound of left breast, subsequent encounter: Secondary | ICD-10-CM | POA: Diagnosis not present

## 2020-12-05 DIAGNOSIS — I10 Essential (primary) hypertension: Secondary | ICD-10-CM

## 2020-12-05 NOTE — Patient Instructions (Signed)
Medication Instructions:  Your physician recommends that you continue on your current medications as directed. Please refer to the Current Medication list given to you today.  *If you need a refill on your cardiac medications before your next appointment, please call your pharmacy*   Lab Work: None If you have labs (blood work) drawn today and your tests are completely normal, you will receive your results only by: [place] (if you have MyChart) OR A paper copy in the mail If you have any lab test that is abnormal or we need to change your treatment, we will call you to review the results.   Testing/Procedures: None   Follow-Up: At [facility], you and your health needs are our priority.  As part of our continuing mission to provide you with exceptional heart care, we have created designated Provider Care Teams.  These Care Teams include your primary Cardiologist (physician) and Advanced Practice Providers (APPs -  Physician Assistants and Nurse Practitioners) who all work together to provide you with the care you need, when you need it.  We recommend signing up for the patient portal called "MyChart".  Sign up information is provided on this After Visit Summary.  MyChart is used to connect with patients for Virtual Visits (Telemedicine).  Patients are able to view lab/test results, encounter notes, upcoming appointments, etc.  Non-urgent messages can be sent to your provider as well.   To learn more about what you can do with MyChart, go to [email or web address].    Your next appointment:   1 year  The format for your next appointment:   In Person  Provider:   Shirlee More, MD    Other Instructions

## 2020-12-05 NOTE — Progress Notes (Signed)
Cardiology Office Note:    Date:  12/05/2020   ID:  Kristen Horne, DOB Jun 18, 1950, MRN [id number]  PCP:  Marco Collie, MD  Cardiologist:  Shirlee More, MD    Referring MD: Marco Collie, MD    ASSESSMENT:    1. Coronary-myocardial bridge   2. Agatston coronary artery calcium score less than 100   3. Essential hypertension   4. Mixed hyperlipidemia    PLAN:    In order of problems listed above:  She continues to have rare episodes of angina.  I reviewed with her that averages typically benign if symptoms are bothersome she could consider surgical intervention We both feel she should continue medical treatment I asked her if she is having episodes more than a few months to contact me and we will place her on long-acting nitroglycerin.  She will continue aspirin beta-blocker and her statin. Blood pressure at target continue current treatment includes thiazide diuretic calcium channel blocker Continue her statin lipids at target   Next appointment: 1 year   Medication Adjustments/Labs and Tests Ordered: Current medicines are reviewed at length with the patient today.  Concerns regarding medicines are outlined above.  Orders Placed This Encounter  Procedures   EKG 12-Lead    No orders of the defined types were placed in this encounter.   Chief Complaint  Patient presents with   Follow-up    For coronary myocardial bridge     [facility]  History of Present Illness:    Kristen Horne is a 70 y.o. female with a hx of coronary myocardial bridge mild nonobstructive CAD coronary calcium score 10 CTA 06/08/2019 hypertension and hyperlipidemia last seen 12/06/2019. Compliance with diet, lifestyle and medications: Yes  Infrequently every couple of months for an episode of chest tightness relieved with nitroglycerin unrelated to activity her last episode occurred when he was driving in: She was lightheaded standing up and I cautioned to remain seated or supine for 15 to 20 minutes  after nitroglycerin Is not having any exertional angina shortness of breath palpitation or syncope.  Most recent labs performed 11/05/2020 cholesterol 164 LDL 99 triglycerides 94 HDL 48 A1c 5.8% potassium was normal.  She tolerates her statin without muscle pain or weakness Past Medical History:  Diagnosis Date   Arthritis    Chest pain 07/29/2017   Chronic kidney disease    stage 2    Coronary-myocardial bridge 11/29/2017   Depression    Essential hypertension 08/19/2017   Heart abnormality    squeezing atery disease-heart spasm   Hyperlipemia    Hyperlipidemia 08/19/2017   Hypertension    Post-operative nausea and vomiting    Sleep apnea    uses CPAP    Past Surgical History:  Procedure Laterality Date   ABDOMINAL HYSTERECTOMY  1999   BREAST REDUCTION SURGERY     COLONOSCOPY  06/01/2010   KNEE ARTHROSCOPY     TUMOR REMOVAL      Current Medications: Current Meds  Medication Sig   amLODipine (NORVASC) 10 MG tablet Take 5 mg by mouth daily.   aspirin 81 MG chewable tablet Chew 81 mg by mouth daily.   atorvastatin (LIPITOR) 20 MG tablet Take 20 mg by mouth at bedtime.    carvedilol (COREG) 3.125 MG tablet TAKE 1 TABLET (3.125 MG TOTAL) BY MOUTH 2 (TWO) TIMES DAILY WITH A MEAL.   citalopram (CELEXA) 40 MG tablet Take 40 mg by mouth at bedtime.    furosemide (LASIX) 20 MG tablet Take 20 mg  by mouth daily as needed.   hydrochlorothiazide (HYDRODIURIL) 25 MG tablet Take 25 mg by mouth daily.   meloxicam (MOBIC) 15 MG tablet Take 15 mg by mouth at bedtime.    nitroGLYCERIN (NITROSTAT) 0.4 MG SL tablet Place 1 tablet under the tongue every 5 (five) minutes as needed for chest pain.   Current Facility-Administered Medications for the 12/05/20 encounter (Office Visit) with Richardo Priest, MD  Medication   0.9 %  sodium chloride infusion     Allergies:   Bee venom   Social History   Socioeconomic History   Marital status: Married    Spouse name: Not on file   Number of  children: Not on file   Years of education: Not on file   Highest education level: Not on file  Occupational History   Not on file  Tobacco Use   Smoking status: Never   Smokeless tobacco: Never  Vaping Use   Vaping Use: Never used  Substance and Sexual Activity   Alcohol use: Not Currently   Drug use: Never   Sexual activity: Not on file  Other Topics Concern   Not on file  Social History Narrative   Not on file   Social Determinants of Health   Financial Resource Strain: Not on file  Food Insecurity: Not on file  Transportation Needs: Not on file  Physical Activity: Not on file  Stress: Not on file  Social Connections: Not on file     Family History: The patient's family history includes Atrial fibrillation in her mother; Coronary artery disease in her father. There is no history of Colon cancer, Colon polyps, Esophageal cancer, Rectal cancer, or Stomach cancer. ROS:   Please see the history of present illness.    All other systems reviewed and are negative.  EKGs/Labs/Other Studies Reviewed:    The following studies were reviewed today:  EKG:  EKG ordered today and personally reviewed.  The ekg ordered today demonstrates sinus rhythm low voltage otherwise normal  Recent Labs: See history   Physical Exam:    VS:  BP 134/84   Pulse 71   Ht 5\' 4"  (1.626 m)   Wt 211 lb 9.6 oz (96 kg)   SpO2 97%   BMI 36.32 kg/m     Wt Readings from Last 3 Encounters:  12/05/20 211 lb 9.6 oz (96 kg)  12/06/19 200 lb 9.6 oz (91 kg)  06/08/19 212 lb 12.8 oz (96.5 kg)     GEN:  Well nourished, well developed in no acute distress HEENT: Normal NECK: No JVD; No carotid bruits LYMPHATICS: No lymphadenopathy CARDIAC: RRR, no murmurs, rubs, gallops RESPIRATORY:  Clear to auscultation without rales, wheezing or rhonchi  ABDOMEN: Soft, non-tender, non-distended MUSCULOSKELETAL:  No edema; No deformity  SKIN: Warm and dry NEUROLOGIC:  Alert and oriented x 3 PSYCHIATRIC:   Normal affect    Signed, Shirlee More, MD  12/05/2020 8:26 AM    [place]

## 2020-12-10 DIAGNOSIS — S21002D Unspecified open wound of left breast, subsequent encounter: Secondary | ICD-10-CM | POA: Diagnosis not present

## 2020-12-15 DIAGNOSIS — L0100 Impetigo, unspecified: Secondary | ICD-10-CM | POA: Diagnosis not present

## 2021-01-12 ENCOUNTER — Other Ambulatory Visit: Payer: Self-pay | Admitting: Cardiology

## 2021-01-13 NOTE — Telephone Encounter (Signed)
RX sent

## 2021-03-24 DIAGNOSIS — R7303 Prediabetes: Secondary | ICD-10-CM | POA: Diagnosis not present

## 2021-03-24 DIAGNOSIS — I129 Hypertensive chronic kidney disease with stage 1 through stage 4 chronic kidney disease, or unspecified chronic kidney disease: Secondary | ICD-10-CM | POA: Diagnosis not present

## 2021-03-30 DIAGNOSIS — E782 Mixed hyperlipidemia: Secondary | ICD-10-CM | POA: Diagnosis not present

## 2021-03-30 DIAGNOSIS — I201 Angina pectoris with documented spasm: Secondary | ICD-10-CM | POA: Diagnosis not present

## 2021-03-30 DIAGNOSIS — R7303 Prediabetes: Secondary | ICD-10-CM | POA: Diagnosis not present

## 2021-04-07 DIAGNOSIS — Z1231 Encounter for screening mammogram for malignant neoplasm of breast: Secondary | ICD-10-CM | POA: Diagnosis not present

## 2021-07-27 DIAGNOSIS — I129 Hypertensive chronic kidney disease with stage 1 through stage 4 chronic kidney disease, or unspecified chronic kidney disease: Secondary | ICD-10-CM | POA: Diagnosis not present

## 2021-07-27 DIAGNOSIS — R7303 Prediabetes: Secondary | ICD-10-CM | POA: Diagnosis not present

## 2021-08-03 DIAGNOSIS — Z0000 Encounter for general adult medical examination without abnormal findings: Secondary | ICD-10-CM | POA: Diagnosis not present

## 2021-08-03 DIAGNOSIS — I129 Hypertensive chronic kidney disease with stage 1 through stage 4 chronic kidney disease, or unspecified chronic kidney disease: Secondary | ICD-10-CM | POA: Diagnosis not present

## 2021-08-03 DIAGNOSIS — Z1339 Encounter for screening examination for other mental health and behavioral disorders: Secondary | ICD-10-CM | POA: Diagnosis not present

## 2021-08-03 DIAGNOSIS — R7303 Prediabetes: Secondary | ICD-10-CM | POA: Diagnosis not present

## 2021-08-03 DIAGNOSIS — E782 Mixed hyperlipidemia: Secondary | ICD-10-CM | POA: Diagnosis not present

## 2021-08-03 DIAGNOSIS — Z1331 Encounter for screening for depression: Secondary | ICD-10-CM | POA: Diagnosis not present

## 2021-08-03 DIAGNOSIS — Z139 Encounter for screening, unspecified: Secondary | ICD-10-CM | POA: Diagnosis not present

## 2021-08-03 DIAGNOSIS — Z1231 Encounter for screening mammogram for malignant neoplasm of breast: Secondary | ICD-10-CM | POA: Diagnosis not present

## 2021-08-03 DIAGNOSIS — Z6835 Body mass index (BMI) 35.0-35.9, adult: Secondary | ICD-10-CM | POA: Diagnosis not present

## 2021-08-03 DIAGNOSIS — Z136 Encounter for screening for cardiovascular disorders: Secondary | ICD-10-CM | POA: Diagnosis not present

## 2021-08-03 DIAGNOSIS — N182 Chronic kidney disease, stage 2 (mild): Secondary | ICD-10-CM | POA: Diagnosis not present

## 2021-08-07 ENCOUNTER — Telehealth: Payer: Self-pay | Admitting: *Deleted

## 2021-08-07 DIAGNOSIS — R0609 Other forms of dyspnea: Secondary | ICD-10-CM

## 2021-08-07 NOTE — Telephone Encounter (Signed)
Request from Dr. Marco Collie for pt to have an echo in our office due to [place]. Order put in and scheduled up front.

## 2021-08-14 ENCOUNTER — Ambulatory Visit (INDEPENDENT_AMBULATORY_CARE_PROVIDER_SITE_OTHER): Payer: PPO

## 2021-08-14 ENCOUNTER — Telehealth: Payer: Self-pay

## 2021-08-14 DIAGNOSIS — R0609 Other forms of dyspnea: Secondary | ICD-10-CM | POA: Diagnosis not present

## 2021-08-14 LAB — ECHOCARDIOGRAM COMPLETE
Area-P 1/2: 2.62 cm2
P 1/2 time: 666 msec
S' Lateral: 2.8 cm

## 2021-08-14 NOTE — Telephone Encounter (Signed)
-----   Message from Richardo Priest, MD sent at 08/14/2021 12:52 PM EDT ----- Normal or stable result  Good result no indication of a lung problem that would cause shortness of breath

## 2021-08-14 NOTE — Telephone Encounter (Signed)
Patient notified of results.

## 2021-08-17 DIAGNOSIS — H524 Presbyopia: Secondary | ICD-10-CM | POA: Diagnosis not present

## 2021-08-17 DIAGNOSIS — H26493 Other secondary cataract, bilateral: Secondary | ICD-10-CM | POA: Diagnosis not present

## 2021-08-24 DIAGNOSIS — R5383 Other fatigue: Secondary | ICD-10-CM | POA: Diagnosis not present

## 2021-08-24 DIAGNOSIS — F5101 Primary insomnia: Secondary | ICD-10-CM | POA: Diagnosis not present

## 2021-08-24 DIAGNOSIS — Z6836 Body mass index (BMI) 36.0-36.9, adult: Secondary | ICD-10-CM | POA: Diagnosis not present

## 2021-08-24 DIAGNOSIS — R32 Unspecified urinary incontinence: Secondary | ICD-10-CM | POA: Diagnosis not present

## 2021-10-12 DIAGNOSIS — Z0000 Encounter for general adult medical examination without abnormal findings: Secondary | ICD-10-CM | POA: Diagnosis not present

## 2021-10-12 DIAGNOSIS — Z6836 Body mass index (BMI) 36.0-36.9, adult: Secondary | ICD-10-CM | POA: Diagnosis not present

## 2021-10-12 DIAGNOSIS — Z1211 Encounter for screening for malignant neoplasm of colon: Secondary | ICD-10-CM | POA: Diagnosis not present

## 2021-11-23 DIAGNOSIS — E782 Mixed hyperlipidemia: Secondary | ICD-10-CM | POA: Diagnosis not present

## 2021-11-23 DIAGNOSIS — I129 Hypertensive chronic kidney disease with stage 1 through stage 4 chronic kidney disease, or unspecified chronic kidney disease: Secondary | ICD-10-CM | POA: Diagnosis not present

## 2021-11-23 DIAGNOSIS — R7303 Prediabetes: Secondary | ICD-10-CM | POA: Diagnosis not present

## 2021-11-30 DIAGNOSIS — E782 Mixed hyperlipidemia: Secondary | ICD-10-CM | POA: Diagnosis not present

## 2021-11-30 DIAGNOSIS — R7303 Prediabetes: Secondary | ICD-10-CM | POA: Diagnosis not present

## 2021-11-30 DIAGNOSIS — I119 Hypertensive heart disease without heart failure: Secondary | ICD-10-CM | POA: Diagnosis not present

## 2021-11-30 DIAGNOSIS — Z23 Encounter for immunization: Secondary | ICD-10-CM | POA: Diagnosis not present

## 2021-11-30 DIAGNOSIS — I25119 Atherosclerotic heart disease of native coronary artery with unspecified angina pectoris: Secondary | ICD-10-CM | POA: Diagnosis not present

## 2021-11-30 DIAGNOSIS — Z6833 Body mass index (BMI) 33.0-33.9, adult: Secondary | ICD-10-CM | POA: Diagnosis not present

## 2022-01-17 ENCOUNTER — Other Ambulatory Visit: Payer: Self-pay | Admitting: Cardiology

## 2022-02-16 ENCOUNTER — Other Ambulatory Visit: Payer: Self-pay | Admitting: Cardiology

## 2022-02-21 NOTE — Progress Notes (Signed)
Cardiology Office Note:    Date:  02/22/2022   ID:  SAJE GALLOP, DOB 12-13-1950, MRN [id number]  PCP:  Marco Collie, MD  Cardiologist:  Shirlee More, MD    Referring MD: Marco Collie, MD    ASSESSMENT:    1. Mild CAD   2. Coronary-myocardial bridge   3. Agatston coronary artery calcium score less than 100   4. Essential hypertension   5. Mixed hyperlipidemia    PLAN:    In order of problems listed above:  [name] continues to do well with her mild CAD and coronary myocardial bridge not having angina she takes low-dose aspirin and statin lipids are at target and blood pressure is very well-controlled Strongly encouraged her to continue her endeavors she has had a marked decrease in weight and a very healthy lifestyle and continue her activity   Next appointment: 1-1/2 years   Medication Adjustments/Labs and Tests Ordered: Current medicines are reviewed at length with the patient today.  Concerns regarding medicines are outlined above.  No orders of the defined types were placed in this encounter.  No orders of the defined types were placed in this encounter.   Chief Complaint  Patient presents with   Coronary Artery Disease   Hyperlipidemia    History of Present Illness:    Kristen Horne is a 72 y.o. female with a hx of mild nonobstructive CAD with a coronary artery calcium score of 10 hypertension hyperlipidemia coronary myocardial bridge on cardiac CTA last seen 12/05/2020.  She is seen today with Kristen Horne, CMA chaperone  She has made a big change in her life she is lost over 40 pounds exercises on a regular basis pool aquatics program and walking feels very well and has had no cardiovascular symptoms of edema shortness of breath chest pain palpitation or syncope She tolerates her statin without muscle pain or weakness 11/23/2021 LDL 64 HDL 44 cholesterol 121 triglycerides 61. Compliance with diet, lifestyle and medications: Yes Past Medical History:  Diagnosis Date    Arthritis    Chest pain 07/29/2017   Chronic kidney disease    stage 2    Coronary-myocardial bridge 11/29/2017   Depression    Essential hypertension 08/19/2017   Heart abnormality    squeezing atery disease-heart spasm   Hyperlipemia    Hyperlipidemia 08/19/2017   Hypertension    Post-operative nausea and vomiting    Sleep apnea    uses CPAP    Past Surgical History:  Procedure Laterality Date   ABDOMINAL HYSTERECTOMY  1999   BREAST REDUCTION SURGERY     COLONOSCOPY  06/01/2010   KNEE ARTHROSCOPY     TUMOR REMOVAL      Current Medications: Current Meds  Medication Sig   amLODipine (NORVASC) 10 MG tablet Take 5 mg by mouth daily.   aspirin 81 MG chewable tablet Chew 81 mg by mouth daily.   atorvastatin (LIPITOR) 20 MG tablet Take 20 mg by mouth at bedtime.    carvedilol (COREG) 3.125 MG tablet TAKE 1 TABLET BY MOUTH TWICE A DAY WITH A MEAL   citalopram (CELEXA) 40 MG tablet Take 40 mg by mouth at bedtime.    furosemide (LASIX) 20 MG tablet Take 20 mg by mouth daily as needed.   hydrochlorothiazide (HYDRODIURIL) 25 MG tablet Take 25 mg by mouth daily.   meloxicam (MOBIC) 15 MG tablet Take 15 mg by mouth at bedtime.    nitroGLYCERIN (NITROSTAT) 0.4 MG SL tablet Place 1 tablet under the  tongue every 5 (five) minutes as needed for chest pain.   Current Facility-Administered Medications for the 02/22/22 encounter (Office Visit) with Richardo Priest, MD  Medication   0.9 %  sodium chloride infusion     Allergies:   Bee venom   Social History   Socioeconomic History   Marital status: Married    Spouse name: Not on file   Number of children: Not on file   Years of education: Not on file   Highest education level: Not on file  Occupational History   Not on file  Tobacco Use   Smoking status: Never   Smokeless tobacco: Never  Vaping Use   Vaping Use: Never used  Substance and Sexual Activity   Alcohol use: Not Currently   Drug use: Never   Sexual activity: Not on  file  Other Topics Concern   Not on file  Social History Narrative   Not on file   Social Determinants of Health   Financial Resource Strain: Not on file  Food Insecurity: Not on file  Transportation Needs: Not on file  Physical Activity: Not on file  Stress: Not on file  Social Connections: Not on file     Family History: The patient's family history includes Atrial fibrillation in her mother; Coronary artery disease in her father. There is no history of Colon cancer, Colon polyps, Esophageal cancer, Rectal cancer, or Stomach cancer. ROS:   Please see the history of present illness.    All other systems reviewed and are negative.  EKGs/Labs/Other Studies Reviewed:    The following studies were reviewed today:  EKG:  EKG ordered today and personally reviewed.  The ekg ordered today demonstrates sinus rhythm poor R wave progression low voltage otherwise normal EKG  Recent Labs: No results found for requested labs within last 365 days.  Recent Lipid Panel    Component Value Date/Time   CHOL 155 07/30/2017 0433   TRIG 60 07/30/2017 0433   HDL 49 07/30/2017 0433   CHOLHDL 3.2 07/30/2017 0433   VLDL 12 07/30/2017 0433   LDLCALC 94 07/30/2017 0433    Physical Exam:    VS:  BP 110/78 (BP Location: Left Arm, Patient Position: Sitting)   Pulse 76   Ht '5\' 4"'$  (1.626 m)   Wt 176 lb (79.8 kg)   SpO2 99%   BMI 30.21 kg/m     Wt Readings from Last 3 Encounters:  02/22/22 176 lb (79.8 kg)  12/05/20 211 lb 9.6 oz (96 kg)  12/06/19 200 lb 9.6 oz (91 kg)     GEN:  Well nourished, well developed in no acute distress HEENT: Normal NECK: No JVD; No carotid bruits LYMPHATICS: No lymphadenopathy CARDIAC: RRR, no murmurs, rubs, gallops RESPIRATORY:  Clear to auscultation without rales, wheezing or rhonchi  ABDOMEN: Soft, non-tender, non-distended MUSCULOSKELETAL:  No edema; No deformity  SKIN: Warm and dry NEUROLOGIC:  Alert and oriented x 3 PSYCHIATRIC:  Normal affect     Signed, Shirlee More, MD  02/22/2022 10:46 AM    [place]

## 2022-02-22 ENCOUNTER — Ambulatory Visit: Payer: PPO | Attending: Cardiology | Admitting: Cardiology

## 2022-02-22 ENCOUNTER — Encounter: Payer: Self-pay | Admitting: Cardiology

## 2022-02-22 VITALS — BP 110/78 | HR 76 | Ht 64.0 in | Wt 176.0 lb

## 2022-02-22 DIAGNOSIS — Q245 Malformation of coronary vessels: Secondary | ICD-10-CM

## 2022-02-22 DIAGNOSIS — R931 Abnormal findings on diagnostic imaging of heart and coronary circulation: Secondary | ICD-10-CM

## 2022-02-22 DIAGNOSIS — E782 Mixed hyperlipidemia: Secondary | ICD-10-CM | POA: Diagnosis not present

## 2022-02-22 DIAGNOSIS — I2510 Atherosclerotic heart disease of native coronary artery without angina pectoris: Secondary | ICD-10-CM

## 2022-02-22 DIAGNOSIS — I10 Essential (primary) hypertension: Secondary | ICD-10-CM | POA: Diagnosis not present

## 2022-02-22 NOTE — Patient Instructions (Signed)
Medication Instructions:  Your physician recommends that you continue on your current medications as directed. Please refer to the Current Medication list given to you today.  *If you need a refill on your cardiac medications before your next appointment, please call your pharmacy*   Lab Work: None If you have labs (blood work) drawn today and your tests are completely normal, you will receive your results only by: [place] (if you have MyChart) OR A paper copy in the mail If you have any lab test that is abnormal or we need to change your treatment, we will call you to review the results.   Testing/Procedures: None   Follow-Up: At [facility], you and your health needs are our priority.  As part of our continuing mission to provide you with exceptional heart care, we have created designated Provider Care Teams.  These Care Teams include your primary Cardiologist (physician) and Advanced Practice Providers (APPs -  Physician Assistants and Nurse Practitioners) who all work together to provide you with the care you need, when you need it.  We recommend signing up for the patient portal called "MyChart".  Sign up information is provided on this After Visit Summary.  MyChart is used to connect with patients for Virtual Visits (Telemedicine).  Patients are able to view lab/test results, encounter notes, upcoming appointments, etc.  Non-urgent messages can be sent to your provider as well.   To learn more about what you can do with MyChart, go to [email or web address].    Your next appointment:   1.5 year follow up   Provider:   Shirlee More, MD    Other Instructions None

## 2022-03-25 DIAGNOSIS — I129 Hypertensive chronic kidney disease with stage 1 through stage 4 chronic kidney disease, or unspecified chronic kidney disease: Secondary | ICD-10-CM | POA: Diagnosis not present

## 2022-03-25 DIAGNOSIS — E782 Mixed hyperlipidemia: Secondary | ICD-10-CM | POA: Diagnosis not present

## 2022-03-25 DIAGNOSIS — R7303 Prediabetes: Secondary | ICD-10-CM | POA: Diagnosis not present

## 2022-03-30 ENCOUNTER — Other Ambulatory Visit: Payer: Self-pay | Admitting: Cardiology

## 2022-04-05 DIAGNOSIS — Z91038 Other insect allergy status: Secondary | ICD-10-CM | POA: Diagnosis not present

## 2022-04-05 DIAGNOSIS — I25119 Atherosclerotic heart disease of native coronary artery with unspecified angina pectoris: Secondary | ICD-10-CM | POA: Diagnosis not present

## 2022-04-05 DIAGNOSIS — Z6828 Body mass index (BMI) 28.0-28.9, adult: Secondary | ICD-10-CM | POA: Diagnosis not present

## 2022-04-05 DIAGNOSIS — I201 Angina pectoris with documented spasm: Secondary | ICD-10-CM | POA: Diagnosis not present

## 2022-04-05 DIAGNOSIS — E876 Hypokalemia: Secondary | ICD-10-CM | POA: Diagnosis not present

## 2022-04-13 DIAGNOSIS — Z1231 Encounter for screening mammogram for malignant neoplasm of breast: Secondary | ICD-10-CM | POA: Diagnosis not present

## 2022-04-19 DIAGNOSIS — E876 Hypokalemia: Secondary | ICD-10-CM | POA: Diagnosis not present

## 2022-05-04 DIAGNOSIS — Z6829 Body mass index (BMI) 29.0-29.9, adult: Secondary | ICD-10-CM | POA: Diagnosis not present

## 2022-05-04 DIAGNOSIS — D1723 Benign lipomatous neoplasm of skin and subcutaneous tissue of right leg: Secondary | ICD-10-CM | POA: Diagnosis not present

## 2022-05-04 DIAGNOSIS — I8390 Asymptomatic varicose veins of unspecified lower extremity: Secondary | ICD-10-CM | POA: Diagnosis not present

## 2022-05-04 DIAGNOSIS — T148XXA Other injury of unspecified body region, initial encounter: Secondary | ICD-10-CM | POA: Diagnosis not present

## 2022-05-04 DIAGNOSIS — Z789 Other specified health status: Secondary | ICD-10-CM | POA: Diagnosis not present

## 2022-05-12 ENCOUNTER — Other Ambulatory Visit: Payer: Self-pay

## 2022-05-12 MED ORDER — NITROGLYCERIN 0.4 MG SL SUBL: 0.4000 mg | SUBLINGUAL_TABLET | SUBLINGUAL | 5 refills | Status: DC | PRN

## 2022-05-12 NOTE — Telephone Encounter (Signed)
Refill sent to pharmacy.   

## 2022-05-19 DIAGNOSIS — F329 Major depressive disorder, single episode, unspecified: Secondary | ICD-10-CM | POA: Diagnosis not present

## 2022-05-19 DIAGNOSIS — R2241 Localized swelling, mass and lump, right lower limb: Secondary | ICD-10-CM | POA: Diagnosis not present

## 2022-05-19 DIAGNOSIS — I129 Hypertensive chronic kidney disease with stage 1 through stage 4 chronic kidney disease, or unspecified chronic kidney disease: Secondary | ICD-10-CM | POA: Diagnosis not present

## 2022-05-20 DIAGNOSIS — I8391 Asymptomatic varicose veins of right lower extremity: Secondary | ICD-10-CM | POA: Diagnosis not present

## 2022-05-20 DIAGNOSIS — M7989 Other specified soft tissue disorders: Secondary | ICD-10-CM | POA: Diagnosis not present

## 2022-05-20 DIAGNOSIS — M7121 Synovial cyst of popliteal space [Baker], right knee: Secondary | ICD-10-CM | POA: Diagnosis not present

## 2022-05-20 DIAGNOSIS — I83891 Varicose veins of right lower extremities with other complications: Secondary | ICD-10-CM | POA: Diagnosis not present

## 2022-05-27 DIAGNOSIS — I25119 Atherosclerotic heart disease of native coronary artery with unspecified angina pectoris: Secondary | ICD-10-CM | POA: Diagnosis not present

## 2022-05-27 DIAGNOSIS — R7303 Prediabetes: Secondary | ICD-10-CM | POA: Diagnosis not present

## 2022-05-27 DIAGNOSIS — I129 Hypertensive chronic kidney disease with stage 1 through stage 4 chronic kidney disease, or unspecified chronic kidney disease: Secondary | ICD-10-CM | POA: Diagnosis not present

## 2022-05-27 DIAGNOSIS — E782 Mixed hyperlipidemia: Secondary | ICD-10-CM | POA: Diagnosis not present

## 2022-05-27 DIAGNOSIS — N182 Chronic kidney disease, stage 2 (mild): Secondary | ICD-10-CM | POA: Diagnosis not present

## 2022-05-27 DIAGNOSIS — D1723 Benign lipomatous neoplasm of skin and subcutaneous tissue of right leg: Secondary | ICD-10-CM | POA: Diagnosis not present

## 2022-05-27 DIAGNOSIS — R2241 Localized swelling, mass and lump, right lower limb: Secondary | ICD-10-CM | POA: Diagnosis not present

## 2022-05-27 DIAGNOSIS — I10 Essential (primary) hypertension: Secondary | ICD-10-CM | POA: Diagnosis not present

## 2022-05-27 DIAGNOSIS — G4733 Obstructive sleep apnea (adult) (pediatric): Secondary | ICD-10-CM | POA: Diagnosis not present

## 2022-06-10 DIAGNOSIS — N182 Chronic kidney disease, stage 2 (mild): Secondary | ICD-10-CM | POA: Diagnosis not present

## 2022-06-10 DIAGNOSIS — Z6828 Body mass index (BMI) 28.0-28.9, adult: Secondary | ICD-10-CM | POA: Diagnosis not present

## 2022-06-10 DIAGNOSIS — I129 Hypertensive chronic kidney disease with stage 1 through stage 4 chronic kidney disease, or unspecified chronic kidney disease: Secondary | ICD-10-CM | POA: Diagnosis not present

## 2022-06-10 DIAGNOSIS — M7121 Synovial cyst of popliteal space [Baker], right knee: Secondary | ICD-10-CM | POA: Diagnosis not present

## 2022-06-11 DIAGNOSIS — M25561 Pain in right knee: Secondary | ICD-10-CM | POA: Insufficient documentation

## 2022-06-19 DIAGNOSIS — I129 Hypertensive chronic kidney disease with stage 1 through stage 4 chronic kidney disease, or unspecified chronic kidney disease: Secondary | ICD-10-CM | POA: Diagnosis not present

## 2022-06-19 DIAGNOSIS — F329 Major depressive disorder, single episode, unspecified: Secondary | ICD-10-CM | POA: Diagnosis not present

## 2022-06-25 DIAGNOSIS — M25561 Pain in right knee: Secondary | ICD-10-CM | POA: Diagnosis not present

## 2022-07-02 DIAGNOSIS — M25561 Pain in right knee: Secondary | ICD-10-CM | POA: Diagnosis not present

## 2022-07-19 DIAGNOSIS — I129 Hypertensive chronic kidney disease with stage 1 through stage 4 chronic kidney disease, or unspecified chronic kidney disease: Secondary | ICD-10-CM | POA: Diagnosis not present

## 2022-07-19 DIAGNOSIS — F329 Major depressive disorder, single episode, unspecified: Secondary | ICD-10-CM | POA: Diagnosis not present

## 2022-07-29 DIAGNOSIS — R7303 Prediabetes: Secondary | ICD-10-CM | POA: Diagnosis not present

## 2022-07-29 DIAGNOSIS — I129 Hypertensive chronic kidney disease with stage 1 through stage 4 chronic kidney disease, or unspecified chronic kidney disease: Secondary | ICD-10-CM | POA: Diagnosis not present

## 2022-07-29 DIAGNOSIS — E782 Mixed hyperlipidemia: Secondary | ICD-10-CM | POA: Diagnosis not present

## 2022-08-02 DIAGNOSIS — Z23 Encounter for immunization: Secondary | ICD-10-CM | POA: Diagnosis not present

## 2022-08-02 DIAGNOSIS — I129 Hypertensive chronic kidney disease with stage 1 through stage 4 chronic kidney disease, or unspecified chronic kidney disease: Secondary | ICD-10-CM | POA: Diagnosis not present

## 2022-08-02 DIAGNOSIS — E782 Mixed hyperlipidemia: Secondary | ICD-10-CM | POA: Diagnosis not present

## 2022-08-02 DIAGNOSIS — R7303 Prediabetes: Secondary | ICD-10-CM | POA: Diagnosis not present

## 2022-08-02 DIAGNOSIS — L03019 Cellulitis of unspecified finger: Secondary | ICD-10-CM | POA: Diagnosis not present

## 2022-08-02 DIAGNOSIS — N182 Chronic kidney disease, stage 2 (mild): Secondary | ICD-10-CM | POA: Diagnosis not present

## 2022-08-02 DIAGNOSIS — Z6828 Body mass index (BMI) 28.0-28.9, adult: Secondary | ICD-10-CM | POA: Diagnosis not present

## 2022-08-02 DIAGNOSIS — Z1231 Encounter for screening mammogram for malignant neoplasm of breast: Secondary | ICD-10-CM | POA: Diagnosis not present

## 2022-08-19 DIAGNOSIS — I129 Hypertensive chronic kidney disease with stage 1 through stage 4 chronic kidney disease, or unspecified chronic kidney disease: Secondary | ICD-10-CM | POA: Diagnosis not present

## 2022-08-19 DIAGNOSIS — F329 Major depressive disorder, single episode, unspecified: Secondary | ICD-10-CM | POA: Diagnosis not present

## 2022-08-23 DIAGNOSIS — H04129 Dry eye syndrome of unspecified lacrimal gland: Secondary | ICD-10-CM | POA: Diagnosis not present

## 2022-08-23 DIAGNOSIS — H10431 Chronic follicular conjunctivitis, right eye: Secondary | ICD-10-CM | POA: Diagnosis not present

## 2022-09-19 DIAGNOSIS — I129 Hypertensive chronic kidney disease with stage 1 through stage 4 chronic kidney disease, or unspecified chronic kidney disease: Secondary | ICD-10-CM | POA: Diagnosis not present

## 2022-09-19 DIAGNOSIS — F329 Major depressive disorder, single episode, unspecified: Secondary | ICD-10-CM | POA: Diagnosis not present

## 2022-10-19 DIAGNOSIS — F329 Major depressive disorder, single episode, unspecified: Secondary | ICD-10-CM | POA: Diagnosis not present

## 2022-10-19 DIAGNOSIS — I129 Hypertensive chronic kidney disease with stage 1 through stage 4 chronic kidney disease, or unspecified chronic kidney disease: Secondary | ICD-10-CM | POA: Diagnosis not present

## 2022-11-02 DIAGNOSIS — L820 Inflamed seborrheic keratosis: Secondary | ICD-10-CM | POA: Diagnosis not present

## 2022-11-02 DIAGNOSIS — L578 Other skin changes due to chronic exposure to nonionizing radiation: Secondary | ICD-10-CM | POA: Diagnosis not present

## 2022-11-02 DIAGNOSIS — L821 Other seborrheic keratosis: Secondary | ICD-10-CM | POA: Diagnosis not present

## 2022-11-02 DIAGNOSIS — C44622 Squamous cell carcinoma of skin of right upper limb, including shoulder: Secondary | ICD-10-CM | POA: Diagnosis not present

## 2022-11-19 DIAGNOSIS — F329 Major depressive disorder, single episode, unspecified: Secondary | ICD-10-CM | POA: Diagnosis not present

## 2022-11-19 DIAGNOSIS — I129 Hypertensive chronic kidney disease with stage 1 through stage 4 chronic kidney disease, or unspecified chronic kidney disease: Secondary | ICD-10-CM | POA: Diagnosis not present

## 2022-11-29 DIAGNOSIS — R7303 Prediabetes: Secondary | ICD-10-CM | POA: Diagnosis not present

## 2022-11-29 DIAGNOSIS — I129 Hypertensive chronic kidney disease with stage 1 through stage 4 chronic kidney disease, or unspecified chronic kidney disease: Secondary | ICD-10-CM | POA: Diagnosis not present

## 2022-11-29 DIAGNOSIS — E782 Mixed hyperlipidemia: Secondary | ICD-10-CM | POA: Diagnosis not present

## 2022-12-06 DIAGNOSIS — E669 Obesity, unspecified: Secondary | ICD-10-CM | POA: Diagnosis not present

## 2022-12-06 DIAGNOSIS — Z136 Encounter for screening for cardiovascular disorders: Secondary | ICD-10-CM | POA: Diagnosis not present

## 2022-12-06 DIAGNOSIS — Z0000 Encounter for general adult medical examination without abnormal findings: Secondary | ICD-10-CM | POA: Diagnosis not present

## 2022-12-06 DIAGNOSIS — Z1339 Encounter for screening examination for other mental health and behavioral disorders: Secondary | ICD-10-CM | POA: Diagnosis not present

## 2022-12-06 DIAGNOSIS — I129 Hypertensive chronic kidney disease with stage 1 through stage 4 chronic kidney disease, or unspecified chronic kidney disease: Secondary | ICD-10-CM | POA: Diagnosis not present

## 2022-12-06 DIAGNOSIS — Z23 Encounter for immunization: Secondary | ICD-10-CM | POA: Diagnosis not present

## 2022-12-06 DIAGNOSIS — E782 Mixed hyperlipidemia: Secondary | ICD-10-CM | POA: Diagnosis not present

## 2022-12-06 DIAGNOSIS — N182 Chronic kidney disease, stage 2 (mild): Secondary | ICD-10-CM | POA: Diagnosis not present

## 2022-12-06 DIAGNOSIS — G4733 Obstructive sleep apnea (adult) (pediatric): Secondary | ICD-10-CM | POA: Diagnosis not present

## 2022-12-06 DIAGNOSIS — Z6829 Body mass index (BMI) 29.0-29.9, adult: Secondary | ICD-10-CM | POA: Diagnosis not present

## 2022-12-06 DIAGNOSIS — Z1331 Encounter for screening for depression: Secondary | ICD-10-CM | POA: Diagnosis not present

## 2022-12-06 DIAGNOSIS — Z139 Encounter for screening, unspecified: Secondary | ICD-10-CM | POA: Diagnosis not present

## 2022-12-08 DIAGNOSIS — G4733 Obstructive sleep apnea (adult) (pediatric): Secondary | ICD-10-CM | POA: Diagnosis not present

## 2022-12-19 DIAGNOSIS — I129 Hypertensive chronic kidney disease with stage 1 through stage 4 chronic kidney disease, or unspecified chronic kidney disease: Secondary | ICD-10-CM | POA: Diagnosis not present

## 2022-12-19 DIAGNOSIS — F329 Major depressive disorder, single episode, unspecified: Secondary | ICD-10-CM | POA: Diagnosis not present

## 2022-12-23 ENCOUNTER — Other Ambulatory Visit: Payer: Self-pay | Admitting: Cardiology

## 2022-12-23 NOTE — Telephone Encounter (Signed)
Rx refill sent to pharmacy. 

## 2023-01-19 DIAGNOSIS — I129 Hypertensive chronic kidney disease with stage 1 through stage 4 chronic kidney disease, or unspecified chronic kidney disease: Secondary | ICD-10-CM | POA: Diagnosis not present

## 2023-01-19 DIAGNOSIS — F329 Major depressive disorder, single episode, unspecified: Secondary | ICD-10-CM | POA: Diagnosis not present

## 2023-02-16 NOTE — Progress Notes (Unsigned)
Cardiology Office Note:  .   Date:  02/17/2023  ID:  Kristen Horne, DOB 12-10-1950, MRN [id number] PCP: Abner Greenspan, MD  [facility] Providers Cardiologist:  Norman Herrlich, MD    History of Present Illness: .   Kristen Horne is a 73 y.o. female  with a past medical history of hypertension, coronary myocardial bridge, sleep apnea, chronic kidney disease, hyperlipidemia.  08/14/21 echo EF 55-60%, mild MR 11/03/17 cCTA calcium score of 10, 57th percentile, non-obstructive CAD, LAD myocardial bridging involving the proximal-mid LAD 08/29/17 monitor average HR 68 bpm, rare PVCs, 1% APCs  Most recently evaluated by Dr. Dulce Sellar on 02/22/22, stable from a cardiac perspective, advised she could follow up in 1 year.   She presents today accompanied by her husband with come concerns of chest pain.  Her chest pain has mixed features, can occur at rest, can also wake her from her sleep.  She has had increased her nitroglycerin use, at times taking up to 3 before her episodes of chest pain are finally relieved.  Occasionally she feels nauseated when she has the pain, otherwise the pain is not accompanied by any other symptoms. She denies palpitations, dyspnea, pnd, orthopnea, n, v, dizziness, syncope, edema, weight gain, or early satiety.    ROS: Review of Systems  Cardiovascular:  Positive for chest pain.  Neurological:  Positive for tingling.  All other systems reviewed and are negative.   Studies Reviewed: Marland Kitchen   EKG Interpretation Date/Time:  Thursday February 17 2023 13:42:55 EST Ventricular Rate:  64 PR Interval:  178 QRS Duration:  64 QT Interval:  514 QTC Calculation: 530 R Axis:   -2  Text Interpretation: Normal sinus rhythm Low voltage QRS Possible Anterolateral infarct (cited on or before 29-Jul-2017) Prolonged QT When compared with ECG of 29-Jul-2017 12:51, Nonspecific T wave abnormality now evident in Lateral leads QT has lengthened Confirmed by Wallis Bamberg [phone]) on 02/17/2023  1:45:34 PM    Cardiac Studies & Procedures     STRESS TESTS  MYOCARDIAL PERFUSION IMAGING 08/03/2017  ECHOCARDIOGRAM  ECHOCARDIOGRAM COMPLETE 08/14/2021  Narrative ECHOCARDIOGRAM REPORT    Patient Name:   Kristen Horne Date of Exam: 08/14/2021 Medical Rec #:  [id number]    Height:       64.0 in Accession #:    [id number]   Weight:       211.6 lb Date of Birth:  October 03, 1950     BSA:          2.004 m Patient Age:    71 years     BP:           134/84 mmHg Patient Gender: F            HR:           68 bpm. Exam Location:  [place]  Procedure: 2D Echo, Cardiac Doppler, Color Doppler and Strain Analysis  Indications:    DOE (dyspnea on exertion) [R06.09]  History:        Patient has prior history of Echocardiogram examinations, most recent 07/30/2017. Risk Factors:Dyslipidemia and Hypertension.  Sonographer:    Margreta Journey RDCS Referring Phys: [id number] [facility]   Sonographer Comments: Suboptimal subcostal window. Image acquisition challenging due to patient body habitus and Image acquisition challenging due to respiratory motion. IMPRESSIONS   1. GLS -15.8. Left ventricular ejection fraction, by estimation, is 55 to 60%. The left ventricle has normal function. The left ventricle has no regional wall motion abnormalities. Left ventricular  diastolic parameters were normal. 2. Right ventricular systolic function is normal. The right ventricular size is normal. There is normal pulmonary artery systolic pressure. 3. The mitral valve is normal in structure. Mild mitral valve regurgitation. No evidence of mitral stenosis. 4. The aortic valve is normal in structure. Aortic valve regurgitation is mild. No aortic stenosis is present. 5. The inferior vena cava is normal in size with greater than 50% respiratory variability, suggesting right atrial pressure of 3 mmHg.  FINDINGS Left Ventricle: GLS -15.8. Left ventricular ejection fraction, by estimation, is 55 to 60%. The left ventricle  has normal function. The left ventricle has no regional wall motion abnormalities. The left ventricular internal cavity size was normal in size. There is no left ventricular hypertrophy. Left ventricular diastolic parameters were normal.  Right Ventricle: The right ventricular size is normal. No increase in right ventricular wall thickness. Right ventricular systolic function is normal. There is normal pulmonary artery systolic pressure. The tricuspid regurgitant velocity is 2.71 m/s, and with an assumed right atrial pressure of 3 mmHg, the estimated right ventricular systolic pressure is 32.4 mmHg.  Left Atrium: Left atrial size was normal in size.  Right Atrium: Right atrial size was normal in size.  Pericardium: There is no evidence of pericardial effusion.  Mitral Valve: The mitral valve is normal in structure. Mild mitral valve regurgitation. No evidence of mitral valve stenosis.  Tricuspid Valve: The tricuspid valve is normal in structure. Tricuspid valve regurgitation is trivial. No evidence of tricuspid stenosis.  Aortic Valve: The aortic valve is normal in structure. Aortic valve regurgitation is mild. Aortic regurgitation PHT measures 666 msec. No aortic stenosis is present.  Pulmonic Valve: The pulmonic valve was normal in structure. Pulmonic valve regurgitation is not visualized. No evidence of pulmonic stenosis.  Aorta: The aortic root is normal in size and structure.  Venous: The inferior vena cava is normal in size with greater than 50% respiratory variability, suggesting right atrial pressure of 3 mmHg.  IAS/Shunts: No atrial level shunt detected by color flow Doppler.   LEFT VENTRICLE PLAX 2D LVIDd:         4.60 cm   Diastology LVIDs:         2.80 cm   LV e' medial:    11.20 cm/s LV PW:         0.90 cm   LV E/e' medial:  8.4 LV IVS:        0.90 cm   LV e' lateral:   8.59 cm/s LVOT diam:     1.80 cm   LV E/e' lateral: 10.9 LV SV:         57 LV SV Index:   28 LVOT  Area:     2.54 cm   RIGHT VENTRICLE             IVC RV Basal diam:  3.10 cm     IVC diam: 1.60 cm RV Mid diam:    2.60 cm RV S prime:     13.60 cm/s TAPSE (M-mode): 3.3 cm  LEFT ATRIUM           Index        RIGHT ATRIUM           Index LA diam:      4.10 cm 2.05 cm/m   RA Area:     19.10 cm LA Vol (A4C): 45.8 ml 22.86 ml/m  RA Volume:   52.40 ml  26.15 ml/m AORTIC VALVE LVOT Vmax:  100.00 cm/s LVOT Vmean:  69.200 cm/s LVOT VTI:    0.223 m AI PHT:      666 msec  AORTA Ao Root diam: 2.90 cm Ao Asc diam:  3.30 cm Ao Desc diam: 2.10 cm  MITRAL VALVE               TRICUSPID VALVE MV Area (PHT): 2.62 cm    TR Peak grad:   29.4 mmHg MV Decel Time: 289 msec    TR Vmax:        271.00 cm/s MV E velocity: 94.00 cm/s MV A velocity: 44.80 cm/s  SHUNTS MV E/A ratio:  2.10        Systemic VTI:  0.22 m Systemic Diam: 1.80 cm  Gypsy Balsam MD Electronically signed by Gypsy Balsam MD Signature Date/Time: 08/14/2021/9:35:26 AM    Final   MONITORS  LONG TERM MONITOR (3-14 DAYS) 08/29/2017  Narrative Long-term monitor was employed for 14 days from 08/29/2017 to 09/12/2017  The indication, palpitation   Ordered Dr. Dulce Sellar  Interpreted Dr. Dulce Sellar  Minimum average and maximum heart rates 45, 68 and 143 bpm  Rare PVCs present 50 without couplets or episodes of ventricular tachycardia  Occasional supraventricular ectopic noted less than 1% burden with 3643 APCs 724 couplets and 3 runs of APCs.  Longest 21 complex the past is 166 bpm.  This is consistent with atrial tachycardia there were no episodes of atrial fibrillation or flutter  Bradycardia, none no episodes of sinus node or AV block.  Symptoms there were 16 events noted 15 unassociated with arrhythmia and one associated with a couple of atrial premature beats  CT SCANS  CT CORONARY MORPH W/CTA COR W/SCORE 11/03/2017  Addendum 11/03/2017  1:56 PM ADDENDUM REPORT: 11/03/2017 13:53  CLINICAL DATA:  Chest  pain  EXAM: Cardiac CTA  MEDICATIONS: Sub lingual nitro. 4mg  x 2  TECHNIQUE: The patient was scanned on a Siemens 192 slice scanner. Gantry rotation speed was 250 msecs. Collimation was 0.6 mm. A 100 kV prospective scan was triggered in the ascending thoracic aorta at 35-75% of the R-R interval. Average HR during the scan was 60 bpm. The 3D data set was interpreted on a dedicated work station using MPR, MIP and VRT modes. A total of 80cc of contrast was used.  FINDINGS: Non-cardiac: See separate report from [facility].  The pulmonary veins drain normally to the left atrium.  Calcium Score: 10 Agatston units.  Coronary Arteries: Left dominant with no anomalies  LM: No plaque or stenosis.  LAD system: Calcified plaque without significant stenosis in the proximal LAD, no other atherosclerotic disease seen in the LAD. There is a relatively long segment of bridging myocardium in the proximal-mid LAD.  Circumflex system: Dominant vessel providing left PDA. No plaque or stenosis.  RCA system: Small, nondominant RCA with no significant disease.  IMPRESSION: 1. Coronary artery calcium score 10 Agatston units. This places the patient in the 57th percentile for age and gender, suggesting intermediate risk for future cardiac events.  2. No obstructive coronary disease. There is a segment of LAD myocardial bridging involving the proximal-mid LAD.  Dalton Mclean   Electronically Signed By: Marca Ancona M.D. On: 11/03/2017 13:53  Narrative EXAM: OVER-READ INTERPRETATION  CT CHEST  The following report is an over-read performed by radiologist Dr. Charlett Nose of [facility] on 11/03/2017. This over-read does not include interpretation of cardiac or coronary anatomy or pathology. The coronary CTA interpretation by the cardiologist is attached.  COMPARISON:  None.  FINDINGS: Vascular: Heart is upper limits normal in size, accentuated by pectus  deformity of the sternum. Visualized aorta is normal caliber.  Mediastinum/Nodes: No adenopathy in the lower mediastinum or hila.  Lungs/Pleura: Lingular scarring. No other confluent airspace opacities or effusions.  Upper Abdomen: Imaging into the upper abdomen shows no acute findings.  Musculoskeletal: Chest wall soft tissues are unremarkable. Pectus deformity of the sternum. No acute bony abnormality.  IMPRESSION: No acute or significant extracardiac abnormality.  Electronically Signed: By: Charlett Nose M.D. On: 11/03/2017 12:19          Risk Assessment/Calculations:             Physical Exam:   VS:  BP 136/83   Pulse 61   Ht 5\' 4"  (1.626 m)   Wt 171 lb 12.8 oz (77.9 kg)   SpO2 94%   BMI 29.49 kg/m    Wt Readings from Last 3 Encounters:  02/17/23 171 lb 12.8 oz (77.9 kg)  02/22/22 176 lb (79.8 kg)  12/05/20 211 lb 9.6 oz (96 kg)    GEN: Well nourished, well developed in no acute distress NECK: No JVD; No carotid bruits CARDIAC: RRR, no murmurs, rubs, gallops RESPIRATORY:  Clear to auscultation without rales, wheezing or rhonchi  ABDOMEN: Soft, non-tender, non-distended EXTREMITIES:  No edema; No deformity   ASSESSMENT AND PLAN: .   Chest pain of uncertain etiology-has mixed features, can occur with exertion or with rest, she has been taken nitroglycerin with increased frequency.  Will arrange for coronary CTA for further evaluation.  Will check BMET.  Coronary myocardial bridge-noted on CT imaging in 2019.  Currently on Norvasc 10 mg daily, Coreg 3.125 mg daily--we could increase this with a resting heart rate of 61, nitroglycerin as needed--will start her on Imdur 30 mg daily.  Mild nonobstructive CAD-noted on CT imaging in 2019, she is having episodes of chest pain, has mixed features, we will arrange for repeat coronary CTA.  Will start her on Imdur 30 milligrams daily.  Continue Norvasc 10 mg daily, continue aspirin 81 mg daily, continue Coreg 3.125 mg twice  daily, continue nitroglycerin  Dyslipidemia-this is formally monitored by her PCP, continue Lipitor 20 mg daily, would prefer her LDL to be less than 70 as she has mild CAD.  Hypertension-blood pressure is controlled at 136/83, continue Norvasc 10 mg daily, continue Lasix 20 mg as needed, continue HCTZ 25 mg daily.        Dispo: Heart Imdur 30 mg daily, Labs per above, coronary CTA, follow-up in 6 months with Dr. Dulce Sellar.  Signed, Flossie Dibble, NP

## 2023-02-17 ENCOUNTER — Ambulatory Visit: Payer: PPO | Attending: Cardiology | Admitting: Cardiology

## 2023-02-17 ENCOUNTER — Encounter: Payer: Self-pay | Admitting: Cardiology

## 2023-02-17 VITALS — BP 136/83 | HR 61 | Ht 64.0 in | Wt 171.8 lb

## 2023-02-17 DIAGNOSIS — Q245 Malformation of coronary vessels: Secondary | ICD-10-CM

## 2023-02-17 DIAGNOSIS — Z79899 Other long term (current) drug therapy: Secondary | ICD-10-CM

## 2023-02-17 DIAGNOSIS — I10 Essential (primary) hypertension: Secondary | ICD-10-CM | POA: Diagnosis not present

## 2023-02-17 DIAGNOSIS — R079 Chest pain, unspecified: Secondary | ICD-10-CM | POA: Diagnosis not present

## 2023-02-17 DIAGNOSIS — R931 Abnormal findings on diagnostic imaging of heart and coronary circulation: Secondary | ICD-10-CM

## 2023-02-17 MED ORDER — ISOSORBIDE MONONITRATE ER 30 MG PO TB24: 30.0000 mg | ORAL_TABLET | Freq: Every day | ORAL | 3 refills | Status: AC

## 2023-02-17 MED ORDER — METOPROLOL TARTRATE 50 MG PO TABS: ORAL_TABLET | ORAL | 0 refills | Status: DC

## 2023-02-17 NOTE — Patient Instructions (Signed)
Medication Instructions:   Start taking Imdur 30 mg in the morning.   The morning of your CT do not take your Carvedilol and do take Metoprolol 50 mg.   Hold your Hydrochlorothiazide and Lasix the morning of your CT.   *If you need a refill on your cardiac medications before your next appointment, please call your pharmacy*   Lab Work:  Today you will have a CBC and CMP.  If you have labs (blood work) drawn today and your tests are completely normal, you will receive your results only by: MyChart Message (if you have MyChart) OR A paper copy in the mail If you have any lab test that is abnormal or we need to change your treatment, we will call you to review the results.   Testing/Procedures:   Your cardiac CT will be scheduled at one of the below locations:     Your cardiac CT will be scheduled at one of the below locations:   [facility] [address] [place], [place] [address] [phone]   At [facility], please arrive at the [facility] and Children's Entrance (Entrance C2) of [facility] 30 minutes prior to test start time. You can use the FREE valet parking offered at entrance C (encouraged to control the heart rate for the test)  Proceed to the [facility] Radiology Department (first floor) to check-in and test prep.  All radiology patients and guests should use entrance C2 at [facility], accessed from [facility], even though the hospital's physical address listed is [address].     Please follow these instructions carefully (unless otherwise directed):  Hold all erectile dysfunction medications at least 3 days (72 hrs) prior to test.  On the Night Before the Test: Be sure to Drink plenty of water. Do not consume any caffeinated/decaffeinated beverages or chocolate 12 hours prior to your test. Do not take any antihistamines 12 hours prior to your test.   On the Day of the Test: Drink plenty of  water until 1 hour prior to the test. Do not eat any food 4 hours prior to the test. You may take your regular medications prior to the test.  Take metoprolol (Lopressor) two hours prior to test. HOLD Furosemide/Hydrochlorothiazide morning of the test. FEMALES- please wear underwire-free bra if available, avoid dresses & tight clothing         After the Test: Drink plenty of water. After receiving IV contrast, you may experience a mild flushed feeling. This is normal. On occasion, you may experience a mild rash up to 24 hours after the test. This is not dangerous. If this occurs, you can take Benadryl 25 mg and increase your fluid intake. If you experience trouble breathing, this can be serious. If it is severe call 911 IMMEDIATELY. If it is mild, please call our office. If you take any of these medications: Glipizide/Metformin, Avandament, Glucavance, please do not take 48 hours after completing test unless otherwise instructed.  We will call to schedule your test 2-4 weeks out understanding that some insurance companies will need an authorization prior to the service being performed.   For non-scheduling related questions, please contact the cardiac imaging nurse navigator should you have any questions/concerns: Rockwell Alexandria, Cardiac Imaging Nurse Navigator Larey Brick, Cardiac Imaging Nurse Navigator [facility] Direct Office Dial: [phone]   For scheduling needs, including cancellations and rescheduling, please call [place], [phone].    Follow-Up: At [facility], you and your health needs are our priority.  As part of our continuing mission to provide you with exceptional heart care, we have created designated Provider Care Teams.  These Care Teams include your primary Cardiologist (physician) and Advanced Practice Providers (APPs -  Physician Assistants and Nurse Practitioners) who all work together to provide you with the care you need,  when you need it.  We recommend signing up for the patient portal called "MyChart".  Sign up information is provided on this After Visit Summary.  MyChart is used to connect with patients for Virtual Visits (Telemedicine).  Patients are able to view lab/test results, encounter notes, upcoming appointments, etc.  Non-urgent messages can be sent to your provider as well.   To learn more about what you can do with MyChart, go to [email or web address].    Your next appointment:   6 month follow up    Other Instructions Cardiac CT Angiogram A cardiac CT angiogram is a procedure to look at the heart and the area around the heart. It may be done to help find the cause of chest pains or other symptoms of heart disease. During this procedure, a substance called contrast dye is injected into the blood vessels in the area to be checked. A large X-ray machine, called a CT scanner, then takes detailed pictures of the heart and the surrounding area. The procedure is also sometimes called a coronary CT angiogram, coronary artery scanning, or CTA. A cardiac CT angiogram allows the health care provider to see how well blood is flowing to and from the heart. The health care provider will be able to see if there are any problems, such as: Blockage or narrowing of the coronary arteries in the heart. Fluid around the heart. Signs of weakness or disease in the muscles, valves, and tissues of the heart. Tell a health care provider about: Any allergies you have. This is especially important if you have had a previous allergic reaction to contrast dye. All medicines you are taking, including vitamins, herbs, eye drops, creams, and over-the-counter medicines. Any blood disorders you have. Any surgeries you have had. Any medical conditions you have. Whether you are pregnant or may be pregnant. Any anxiety disorders, chronic pain, or other conditions you have that may increase your stress or prevent you from lying  still. What are the risks? Generally, this is a safe procedure. However, problems may occur, including: Bleeding. Infection. Allergic reactions to medicines or dyes. Damage to other structures or organs. Kidney damage from the contrast dye that is used. Increased risk of cancer from radiation exposure. This risk is low. Talk with your health care provider about: The risks and benefits of testing. How you can receive the lowest dose of radiation. What happens before the procedure? Wear comfortable clothing and remove any jewelry, glasses, dentures, and hearing aids. Follow instructions from your health care provider about eating and drinking. This may include: For 12 hours before the procedure -- avoid caffeine. This includes tea, coffee, soda, energy drinks, and diet pills. Drink plenty of water or other fluids that do not have caffeine in them. Being well hydrated can prevent complications. For 4-6 hours before the procedure -- stop eating and drinking. The contrast dye can cause nausea, but this is less likely if your stomach is empty. Ask your health care provider about changing or stopping your regular medicines. This is especially important if you are taking diabetes medicines, blood thinners, or medicines to treat problems with erections (erectile  dysfunction). What happens during the procedure?  Hair on your chest may need to be removed so that small sticky patches called electrodes can be placed on your chest. These will transmit information that helps to monitor your heart during the procedure. An IV will be inserted into one of your veins. You might be given a medicine to control your heart rate during the procedure. This will help to ensure that good images are obtained. You will be asked to lie on an exam table. This table will slide in and out of the CT machine during the procedure. Contrast dye will be injected into the IV. You might feel warm, or you may get a metallic taste in  your mouth. You will be given a medicine called nitroglycerin. This will relax or dilate the arteries in your heart. The table that you are lying on will move into the CT machine tunnel for the scan. The person running the machine will give you instructions while the scans are being done. You may be asked to: Keep your arms above your head. Hold your breath. Stay very still, even if the table is moving. When the scanning is complete, you will be moved out of the machine. The IV will be removed. The procedure may vary among health care providers and hospitals. What can I expect after the procedure? After your procedure, it is common to have: A metallic taste in your mouth from the contrast dye. A feeling of warmth. A headache from the nitroglycerin. Follow these instructions at home: Take over-the-counter and prescription medicines only as told by your health care provider. If you are told, drink enough fluid to keep your urine pale yellow. This will help to flush the contrast dye out of your body. Most people can return to their normal activities right after the procedure. Ask your health care provider what activities are safe for you. It is up to you to get the results of your procedure. Ask your health care provider, or the department that is doing the procedure, when your results will be ready. Keep all follow-up visits as told by your health care provider. This is important. Contact a health care provider if: You have any symptoms of allergy to the contrast dye. These include: Shortness of breath. Rash or hives. A racing heartbeat. Summary A cardiac CT angiogram is a procedure to look at the heart and the area around the heart. It may be done to help find the cause of chest pains or other symptoms of heart disease. During this procedure, a large X-ray machine, called a CT scanner, takes detailed pictures of the heart and the surrounding area after a contrast dye has been injected into  blood vessels in the area. Ask your health care provider about changing or stopping your regular medicines before the procedure. This is especially important if you are taking diabetes medicines, blood thinners, or medicines to treat erectile dysfunction. If you are told, drink enough fluid to keep your urine pale yellow. This will help to flush the contrast dye out of your body. This information is not intended to replace advice given to you by your health care provider. Make sure you discuss any questions you have with your health care provider. Document Revised: 08/30/2018 Document Reviewed: 08/30/2018 Elsevier Patient Education  2020 [employer].

## 2023-02-18 ENCOUNTER — Other Ambulatory Visit: Payer: Self-pay

## 2023-02-18 DIAGNOSIS — Z79899 Other long term (current) drug therapy: Secondary | ICD-10-CM

## 2023-02-18 LAB — CBC WITH DIFFERENTIAL/PLATELET
Basophils Absolute: 0.1 x10E3/uL (ref 0.0–0.2)
Basos: 1 %
EOS (ABSOLUTE): 0.1 x10E3/uL (ref 0.0–0.4)
Eos: 2 %
Hematocrit: 44.9 % (ref 34.0–46.6)
Hemoglobin: 15.1 g/dL (ref 11.1–15.9)
Immature Grans (Abs): 0 x10E3/uL (ref 0.0–0.1)
Immature Granulocytes: 0 %
Lymphocytes Absolute: 1.1 x10E3/uL (ref 0.7–3.1)
Lymphs: 18 %
MCH: 29.5 pg (ref 26.6–33.0)
MCHC: 33.6 g/dL (ref 31.5–35.7)
MCV: 88 fL (ref 79–97)
Monocytes Absolute: 0.5 x10E3/uL (ref 0.1–0.9)
Monocytes: 8 %
Neutrophils Absolute: 4.4 x10E3/uL (ref 1.4–7.0)
Neutrophils: 71 %
Platelets: 308 x10E3/uL (ref 150–450)
RBC: 5.11 x10E6/uL (ref 3.77–5.28)
RDW: 12.1 % (ref 11.7–15.4)
WBC: 6.2 x10E3/uL (ref 3.4–10.8)

## 2023-02-18 LAB — COMPREHENSIVE METABOLIC PANEL WITH GFR
ALT: 19 IU/L (ref 0–32)
AST: 20 IU/L (ref 0–40)
Albumin: 4.6 g/dL (ref 3.8–4.8)
Alkaline Phosphatase: 96 IU/L (ref 44–121)
BUN/Creatinine Ratio: 22 (ref 12–28)
BUN: 21 mg/dL (ref 8–27)
Bilirubin Total: 1.2 mg/dL (ref 0.0–1.2)
CO2: 27 mmol/L (ref 20–29)
Calcium: 9.6 mg/dL (ref 8.7–10.3)
Chloride: 95 mmol/L — ABNORMAL LOW (ref 96–106)
Creatinine, Ser: 0.95 mg/dL (ref 0.57–1.00)
Globulin, Total: 2.1 g/dL (ref 1.5–4.5)
Glucose: 85 mg/dL (ref 70–99)
Potassium: 3.3 mmol/L — ABNORMAL LOW (ref 3.5–5.2)
Sodium: 141 mmol/L (ref 134–144)
Total Protein: 6.7 g/dL (ref 6.0–8.5)
eGFR: 64 mL/min/1.73

## 2023-02-19 DIAGNOSIS — I129 Hypertensive chronic kidney disease with stage 1 through stage 4 chronic kidney disease, or unspecified chronic kidney disease: Secondary | ICD-10-CM | POA: Diagnosis not present

## 2023-02-19 DIAGNOSIS — F329 Major depressive disorder, single episode, unspecified: Secondary | ICD-10-CM | POA: Diagnosis not present

## 2023-02-28 ENCOUNTER — Telehealth (HOSPITAL_COMMUNITY): Payer: Self-pay | Admitting: *Deleted

## 2023-02-28 NOTE — Telephone Encounter (Signed)
 Reaching out to patient to offer assistance regarding upcoming cardiac imaging study; pt verbalizes understanding of appt date/time, parking situation and where to check in, pre-test NPO status and verified current allergies; name and call back number provided for further questions should they arise  Chase Copping RN Navigator Cardiac Imaging [facility] [phone] office [phone] cell  Patient to take her daily medications. She is aware to arrive 11 AM.

## 2023-03-01 ENCOUNTER — Ambulatory Visit (HOSPITAL_COMMUNITY)
Admission: RE | Admit: 2023-03-01 | Discharge: 2023-03-01 | Disposition: A | Discharge status: Home / Self Care | Payer: PPO | Source: Ambulatory Visit | Attending: Cardiology | Admitting: Cardiology

## 2023-03-01 DIAGNOSIS — I2510 Atherosclerotic heart disease of native coronary artery without angina pectoris: Secondary | ICD-10-CM | POA: Diagnosis not present

## 2023-03-01 DIAGNOSIS — R079 Chest pain, unspecified: Secondary | ICD-10-CM

## 2023-03-01 MED ORDER — IOHEXOL 350 MG/ML SOLN
95.0000 mL | Freq: Once | INTRAVENOUS | Status: AC | PRN
  Administered 2023-03-01: 95 mL via INTRAVENOUS

## 2023-03-01 MED ORDER — NITROGLYCERIN 0.4 MG SL SUBL
0.8000 mg | SUBLINGUAL_TABLET | Freq: Once | SUBLINGUAL | Status: AC
  Administered 2023-03-01: 0.8 mg via SUBLINGUAL

## 2023-03-01 MED ORDER — NITROGLYCERIN 0.4 MG SL SUBL
SUBLINGUAL_TABLET | SUBLINGUAL | Status: AC
  Filled 2023-03-01: qty 2

## 2023-03-01 NOTE — Progress Notes (Signed)
Patient tolerated CT well. Vital signs stable encourage to drink water throughout day.Reasons explained and verbalized understanding. Ambulated steady gait.

## 2023-03-10 DIAGNOSIS — G4733 Obstructive sleep apnea (adult) (pediatric): Secondary | ICD-10-CM | POA: Diagnosis not present

## 2023-03-19 DIAGNOSIS — F329 Major depressive disorder, single episode, unspecified: Secondary | ICD-10-CM | POA: Diagnosis not present

## 2023-03-19 DIAGNOSIS — I129 Hypertensive chronic kidney disease with stage 1 through stage 4 chronic kidney disease, or unspecified chronic kidney disease: Secondary | ICD-10-CM | POA: Diagnosis not present

## 2023-03-30 DIAGNOSIS — E782 Mixed hyperlipidemia: Secondary | ICD-10-CM | POA: Diagnosis not present

## 2023-03-30 DIAGNOSIS — I129 Hypertensive chronic kidney disease with stage 1 through stage 4 chronic kidney disease, or unspecified chronic kidney disease: Secondary | ICD-10-CM | POA: Diagnosis not present

## 2023-03-30 DIAGNOSIS — R7303 Prediabetes: Secondary | ICD-10-CM | POA: Diagnosis not present

## 2023-04-04 DIAGNOSIS — I25119 Atherosclerotic heart disease of native coronary artery with unspecified angina pectoris: Secondary | ICD-10-CM | POA: Diagnosis not present

## 2023-04-04 DIAGNOSIS — N182 Chronic kidney disease, stage 2 (mild): Secondary | ICD-10-CM | POA: Diagnosis not present

## 2023-04-04 DIAGNOSIS — R7303 Prediabetes: Secondary | ICD-10-CM | POA: Diagnosis not present

## 2023-04-04 DIAGNOSIS — E782 Mixed hyperlipidemia: Secondary | ICD-10-CM | POA: Diagnosis not present

## 2023-04-04 DIAGNOSIS — I129 Hypertensive chronic kidney disease with stage 1 through stage 4 chronic kidney disease, or unspecified chronic kidney disease: Secondary | ICD-10-CM | POA: Diagnosis not present

## 2023-04-04 DIAGNOSIS — Z6829 Body mass index (BMI) 29.0-29.9, adult: Secondary | ICD-10-CM | POA: Diagnosis not present

## 2023-04-19 DIAGNOSIS — Z6829 Body mass index (BMI) 29.0-29.9, adult: Secondary | ICD-10-CM | POA: Diagnosis not present

## 2023-04-19 DIAGNOSIS — E782 Mixed hyperlipidemia: Secondary | ICD-10-CM | POA: Diagnosis not present

## 2023-04-19 DIAGNOSIS — E669 Obesity, unspecified: Secondary | ICD-10-CM | POA: Diagnosis not present

## 2023-04-21 DIAGNOSIS — Z1231 Encounter for screening mammogram for malignant neoplasm of breast: Secondary | ICD-10-CM | POA: Diagnosis not present

## 2023-05-19 DIAGNOSIS — E782 Mixed hyperlipidemia: Secondary | ICD-10-CM | POA: Diagnosis not present

## 2023-05-19 DIAGNOSIS — E669 Obesity, unspecified: Secondary | ICD-10-CM | POA: Diagnosis not present

## 2023-05-19 DIAGNOSIS — Z6829 Body mass index (BMI) 29.0-29.9, adult: Secondary | ICD-10-CM | POA: Diagnosis not present

## 2023-06-07 DIAGNOSIS — G4733 Obstructive sleep apnea (adult) (pediatric): Secondary | ICD-10-CM | POA: Diagnosis not present

## 2023-06-19 DIAGNOSIS — E782 Mixed hyperlipidemia: Secondary | ICD-10-CM | POA: Diagnosis not present

## 2023-06-19 DIAGNOSIS — E669 Obesity, unspecified: Secondary | ICD-10-CM | POA: Diagnosis not present

## 2023-07-17 ENCOUNTER — Other Ambulatory Visit: Payer: Self-pay | Admitting: Cardiology

## 2023-07-18 NOTE — Telephone Encounter (Signed)
 Rx refill sent to pharmacy.

## 2023-08-01 DIAGNOSIS — M25561 Pain in right knee: Secondary | ICD-10-CM | POA: Diagnosis not present

## 2023-08-19 DIAGNOSIS — N182 Chronic kidney disease, stage 2 (mild): Secondary | ICD-10-CM | POA: Diagnosis not present

## 2023-08-19 DIAGNOSIS — E782 Mixed hyperlipidemia: Secondary | ICD-10-CM | POA: Diagnosis not present

## 2023-08-24 DIAGNOSIS — Z961 Presence of intraocular lens: Secondary | ICD-10-CM | POA: Diagnosis not present

## 2023-08-26 ENCOUNTER — Other Ambulatory Visit: Payer: Self-pay | Admitting: Cardiology

## 2023-08-29 DIAGNOSIS — E782 Mixed hyperlipidemia: Secondary | ICD-10-CM | POA: Diagnosis not present

## 2023-08-29 DIAGNOSIS — I129 Hypertensive chronic kidney disease with stage 1 through stage 4 chronic kidney disease, or unspecified chronic kidney disease: Secondary | ICD-10-CM | POA: Diagnosis not present

## 2023-08-29 DIAGNOSIS — R7303 Prediabetes: Secondary | ICD-10-CM | POA: Diagnosis not present

## 2023-09-05 DIAGNOSIS — R7303 Prediabetes: Secondary | ICD-10-CM | POA: Diagnosis not present

## 2023-09-05 DIAGNOSIS — I129 Hypertensive chronic kidney disease with stage 1 through stage 4 chronic kidney disease, or unspecified chronic kidney disease: Secondary | ICD-10-CM | POA: Diagnosis not present

## 2023-09-05 DIAGNOSIS — N182 Chronic kidney disease, stage 2 (mild): Secondary | ICD-10-CM | POA: Diagnosis not present

## 2023-09-05 DIAGNOSIS — Z6830 Body mass index (BMI) 30.0-30.9, adult: Secondary | ICD-10-CM | POA: Diagnosis not present

## 2023-09-05 DIAGNOSIS — E782 Mixed hyperlipidemia: Secondary | ICD-10-CM | POA: Diagnosis not present

## 2023-09-07 DIAGNOSIS — G4733 Obstructive sleep apnea (adult) (pediatric): Secondary | ICD-10-CM | POA: Diagnosis not present

## 2023-09-20 ENCOUNTER — Encounter: Payer: Self-pay | Admitting: Cardiology

## 2023-09-20 NOTE — Progress Notes (Unsigned)
 Cardiology Office Note:    Date:  09/22/2023   ID:  Kristen Horne, DOB [birth year], MRN [id number]  PCP:  Trinidad Hun, MD  Cardiologist:  Redell Leiter, MD    Referring MD: Trinidad Hun, MD    ASSESSMENT:    1. Mild CAD   2. Agatston coronary artery calcium  score less than 100   3. Coronary-myocardial bridge   4. Essential hypertension   5. Mixed hyperlipidemia    PLAN:    In order of problems listed above:  Nikea continues to do well with her mild CAD and myocardial bridge continue current treatment including her long-acting oral nitrate calcium  channel blocker aspirin  statin and nitroglycerin  as needed.  Unless she had a change in her clinical pattern I would not repeat a cardiac CTA at this time is very rare for coronary myocardial bridge to require intervention Hypertension well-controlled continue current treatment including her diuretic beta-blocker Continue her current lipid-lowering therapy most recent labs 08/29/2023 cholesterol 148 LDL 78 non-HDL cholesterol 94   Next appointment: 1 year   Medication Adjustments/Labs and Tests Ordered: Current medicines are reviewed at length with the patient today.  Concerns regarding medicines are outlined above.  No orders of the defined types were placed in this encounter.  No orders of the defined types were placed in this encounter.    History of Present Illness:    Kristen Horne is a 73 y.o. female with a hx of mild nonobstructive CAD with a coronary calcium  score of 10 coronary myocardial bridge on cardiac CTA hypertension and hyperlipidemia last seen 02/22/2022.  Compliance with diet, lifestyle and medications: Yes she is seen in the office today along with her husband Overall is done well has rare anginal discomfort but had 1 episode of prolonged chest pain with a great emotional stress and her husband's metastatic melanoma diagnosis and treatment She tolerates her medications without side effect she has had no muscle pain or  weakness. She is not having exertional angina Past Medical History:  Diagnosis Date   Arthralgia of right knee 06/11/2022   Arthritis    Chest pain 07/29/2017   Chronic kidney disease    stage 2    Coronary-myocardial bridge 11/29/2017   Depression    Essential hypertension 08/19/2017   Heart abnormality    squeezing atery disease-heart spasm   Hyperlipemia    Hyperlipidemia 08/19/2017   Hypertension    Post-operative nausea and vomiting    Sleep apnea    uses CPAP    Current Medications: Current Meds  Medication Sig   amLODipine  (NORVASC ) 5 MG tablet Take 5 mg by mouth daily.   aspirin  81 MG chewable tablet Chew 81 mg by mouth daily.   atorvastatin  (LIPITOR) 20 MG tablet Take 20 mg by mouth at bedtime.    carvedilol  (COREG ) 3.125 MG tablet Take 1 tablet (3.125 mg total) by mouth 2 (two) times daily with a meal.   citalopram  (CELEXA ) 40 MG tablet Take 40 mg by mouth at bedtime.    fluticasone (FLONASE) 50 MCG/ACT nasal spray Place 1 spray into both nostrils daily.   furosemide (LASIX) 20 MG tablet Take 20 mg by mouth daily as needed.   GEMTESA 75 MG TABS Take 75 mg by mouth as needed.   hydrochlorothiazide (HYDRODIURIL) 25 MG tablet Take 25 mg by mouth daily.   isosorbide  mononitrate (IMDUR ) 30 MG 24 hr tablet Take 1 tablet (30 mg total) by mouth daily.   meloxicam (MOBIC) 15 MG tablet Take 15  mg by mouth at bedtime.    montelukast (SINGULAIR) 10 MG tablet Take 10 mg by mouth at bedtime.   nitroGLYCERIN  (NITROSTAT ) 0.4 MG SL tablet PLACE 1 TABLET UNDER THE TONGUE EVERY 5 MINUTES AS NEEDED FOR CHEST PAIN.   tolterodine (DETROL LA) 4 MG 24 hr capsule Take 4 mg by mouth daily.   traZODone (DESYREL) 50 MG tablet Take 100-150 mg by mouth at bedtime.   Current Facility-Administered Medications for the 09/22/23 encounter (Office Visit) with Monetta Redell PARAS, MD  Medication   0.9 %  sodium chloride  infusion      EKGs/Labs/Other Studies Reviewed:    The following studies were  reviewed today:  Cardiac Studies & Procedures   ______________________________________________________________________________________________   STRESS TESTS  MYOCARDIAL PERFUSION IMAGING 08/03/2017   ECHOCARDIOGRAM  ECHOCARDIOGRAM COMPLETE 08/14/2021  Narrative ECHOCARDIOGRAM REPORT    Patient Name:   Kristen Horne Date of Exam: 08/14/2021 Medical Rec #:  [id number]    Height:       64.0 in Accession #:    [id number]   Weight:       211.6 lb Date of Birth:  [birth year]     BSA:          2.004 m Patient Age:    71 years     BP:           134/84 mmHg Patient Gender: F            HR:           68 bpm. Exam Location:  [place]  Procedure: 2D Echo, Cardiac Doppler, Color Doppler and Strain Analysis  Indications:    DOE (dyspnea on exertion) [R06.09]  History:        Patient has prior history of Echocardiogram examinations, most recent 07/30/2017. Risk Factors:Dyslipidemia and Hypertension.  Sonographer:    Charlie Jointer RDCS Referring Phys: [id number] Gean[facility]   Sonographer Comments: Suboptimal subcostal window. Image acquisition challenging due to patient body habitus and Image acquisition challenging due to respiratory motion. IMPRESSIONS   1. GLS -15.8. Left ventricular ejection fraction, by estimation, is 55 to 60%. The left ventricle has normal function. The left ventricle has no regional wall motion abnormalities. Left ventricular diastolic parameters were normal. 2. Right ventricular systolic function is normal. The right ventricular size is normal. There is normal pulmonary artery systolic pressure. 3. The mitral valve is normal in structure. Mild mitral valve regurgitation. No evidence of mitral stenosis. 4. The aortic valve is normal in structure. Aortic valve regurgitation is mild. No aortic stenosis is present. 5. The inferior vena cava is normal in size with greater than 50% respiratory variability, suggesting right atrial pressure of 3 mmHg.  FINDINGS Left  Ventricle: GLS -15.8. Left ventricular ejection fraction, by estimation, is 55 to 60%. The left ventricle has normal function. The left ventricle has no regional wall motion abnormalities. The left ventricular internal cavity size was normal in size. There is no left ventricular hypertrophy. Left ventricular diastolic parameters were normal.  Right Ventricle: The right ventricular size is normal. No increase in right ventricular wall thickness. Right ventricular systolic function is normal. There is normal pulmonary artery systolic pressure. The tricuspid regurgitant velocity is 2.71 m/s, and with an assumed right atrial pressure of 3 mmHg, the estimated right ventricular systolic pressure is 32.4 mmHg.  Left Atrium: Left atrial size was normal in size.  Right Atrium: Right atrial size was normal in size.  Pericardium: There is no evidence of pericardial  effusion.  Mitral Valve: The mitral valve is normal in structure. Mild mitral valve regurgitation. No evidence of mitral valve stenosis.  Tricuspid Valve: The tricuspid valve is normal in structure. Tricuspid valve regurgitation is trivial. No evidence of tricuspid stenosis.  Aortic Valve: The aortic valve is normal in structure. Aortic valve regurgitation is mild. Aortic regurgitation PHT measures 666 msec. No aortic stenosis is present.  Pulmonic Valve: The pulmonic valve was normal in structure. Pulmonic valve regurgitation is not visualized. No evidence of pulmonic stenosis.  Aorta: The aortic root is normal in size and structure.  Venous: The inferior vena cava is normal in size with greater than 50% respiratory variability, suggesting right atrial pressure of 3 mmHg.  IAS/Shunts: No atrial level shunt detected by color flow Doppler.   LEFT VENTRICLE PLAX 2D LVIDd:         4.60 cm   Diastology LVIDs:         2.80 cm   LV e' medial:    11.20 cm/s LV PW:         0.90 cm   LV E/e' medial:  8.4 LV IVS:        0.90 cm   LV e' lateral:    8.59 cm/s LVOT diam:     1.80 cm   LV E/e' lateral: 10.9 LV SV:         57 LV SV Index:   28 LVOT Area:     2.54 cm   RIGHT VENTRICLE             IVC RV Basal diam:  3.10 cm     IVC diam: 1.60 cm RV Mid diam:    2.60 cm RV S prime:     13.60 cm/s TAPSE (M-mode): 3.3 cm  LEFT ATRIUM           Index        RIGHT ATRIUM           Index LA diam:      4.10 cm 2.05 cm/m   RA Area:     19.10 cm LA Vol (A4C): 45.8 ml 22.86 ml/m  RA Volume:   52.40 ml  26.15 ml/m AORTIC VALVE LVOT Vmax:   100.00 cm/s LVOT Vmean:  69.200 cm/s LVOT VTI:    0.223 m AI PHT:      666 msec  AORTA Ao Root diam: 2.90 cm Ao Asc diam:  3.30 cm Ao Desc diam: 2.10 cm  MITRAL VALVE               TRICUSPID VALVE MV Area (PHT): 2.62 cm    TR Peak grad:   29.4 mmHg MV Decel Time: 289 msec    TR Vmax:        271.00 cm/s MV E velocity: 94.00 cm/s MV A velocity: 44.80 cm/s  SHUNTS MV E/A ratio:  2.10        Systemic VTI:  0.22 m Systemic Diam: 1.80 cm  Lamar Fitch MD Electronically signed by Lamar Fitch MD Signature Date/Time: 08/14/2021/9:35:26 AM    Final    MONITORS  LONG TERM MONITOR (3-14 DAYS) 08/29/2017  Narrative Long-term monitor was employed for 14 days from 08/29/2017 to 09/12/2017  The indication, palpitation   Ordered Dr. Monetta  Interpreted Dr. Monetta  Minimum average and maximum heart rates 45, 68 and 143 bpm  Rare PVCs present 50 without couplets or episodes of ventricular tachycardia  Occasional supraventricular ectopic noted less than 1% burden with 6356  APCs 724 couplets and 3 runs of APCs.  Longest 21 complex the past is 166 bpm.  This is consistent with atrial tachycardia there were no episodes of atrial fibrillation or flutter  Bradycardia, none no episodes of sinus node or AV block.  Symptoms there were 16 events noted 15 unassociated with arrhythmia and one associated with a couple of atrial premature beats   CT SCANS  CT CORONARY MORPH W/CTA COR  W/SCORE 03/01/2023  Addendum 03/15/2023  9:34 PM ADDENDUM REPORT: 03/15/2023 21:31  EXAM: OVER-READ INTERPRETATION  CT CHEST  The following report is an over-read performed by radiologist Dr. Suzen Dials of [facility] on 03/15/2023. This over-read does not include interpretation of cardiac or coronary anatomy or pathology. The coronary calcium  score/coronary CTA interpretation by the cardiologist is attached.  COMPARISON:  November 03, 2017  FINDINGS: Cardiovascular: There are no significant extracardiac vascular findings.  Mediastinum/Nodes: There are no enlarged lymph nodes within the visualized mediastinum.  Lungs/Pleura: There is no pleural effusion. The visualized lungs appear clear.  Upper abdomen: No significant findings in the visualized upper abdomen.  Musculoskeletal/Chest wall: No chest wall mass or suspicious osseous findings within the visualized chest.  IMPRESSION: No significant extracardiac findings within the visualized chest.   Electronically Signed By: Suzen Dials M.D. On: 03/15/2023 21:31  Narrative CLINICAL DATA:  73 Year-old Female  EXAM: Cardiac/Coronary  CTA  TECHNIQUE: The patient was scanned on a [employer].  FINDINGS: Scan was triggered in the descending thoracic aorta. Axial non-contrast 3 mm slices were carried out through the heart. The data set was analyzed on a dedicated work station and scored using the Agatson method. Gantry rotation speed was 250 msecs and collimation was .6 mm. 0.8 mg of sl NTG was given. The 3D data set was reconstructed in 5% intervals of the 67-82 % of the R-R cycle. Diastolic phases were analyzed on a dedicated work station using MPR, MIP and VRT modes. The patient received 95 cc of contrast.  Coronary Arteries:  Normal coronary origin.  Left dominance.  Coronary Calcium  Score:  Left main: 34  Left anterior descending artery: 31  Left circumflex artery:  0  Right coronary artery: 0  Total: 65  Percentile: 64th for age, sex, and race matched control.  RCA is a small non-dominant vessel.  There is no plaque.  Left main is a large artery that gives rise to LAD and LCX arteries. Minimal non-obstructive calcified plaque (1-24%) in the body of the left main.  LAD is a large vessel that gives rise to two large diagonal vessels. Minimal non-obstructive calcified plaque (1-24%) in proximal LAD.  LCX is a dominant artery that gives rise to one OM vessel and an L-PDA. There is no significant plaque. Slab artifact noted.  Other non-coronary findings:  Aorta: Normal size.  Aortic atherosclerosis.  No dissection.  Main Pulmonary Artery: Normal size of the pulmonary artery.  Systemic Veins: Normal drainage  Normal pulmonary vein drainage into the left atrium.  Normal left atrial appendage without a thrombus.  Interatrial septum with no clear communications.  Chamber dimensions: Normal dimensions.  Aortic Valve:  Tri-leaflet.  No calcifications.  Mitral valve: No calcifications.  Pericardium: Normal thickness.  Extra-cardiac findings: See attached radiology report for non-cardiac structures.  Artifact: Slab artifact  Image quality: Good  IMPRESSION: 1. Coronary calcium  score of 65. This was 64th percentile for age, sex, and race matched control.  2. Normal coronary origin with left dominance.  3. CAD-RADS  1. Minimal non-obstructive CAD (1-24%). Consider non-atherosclerotic causes of chest pain. Consider preventive therapy and risk factor modification.  RECOMMENDATIONS: RECOMMENDATIONS The proposed cut-off value of 1,651 AU yielded a 93 % sensitivity and 75 % specificity in grading AS severity in patients with classical low-flow, low-gradient AS. Proposed different cut-off values to define severe AS for men and women as 2,065 AU and 1,274 AU, respectively. The joint European and American recommendations for the  assessment of AS consider the aortic valve calcium  score as a continuum - a very high calcium  score suggests severe AS and a low calcium  score suggests severe AS is unlikely.  Donney VEAR Jarome LULLA Stephen RENETTE, et al. 2017 ESC/EACTS Guidelines for the management of valvular heart disease. Eur Heart J 2017;38:2739-91.  Coronary artery calcium  (CAC) score is a strong predictor of incident coronary heart disease (CHD) and provides predictive information beyond traditional risk factors. CAC scoring is reasonable to use in the decision to withhold, postpone, or initiate statin therapy in intermediate-risk or selected borderline-risk asymptomatic adults (age 75-75 years and LDL-C >=70 to <190 mg/dL) who do not have diabetes or established atherosclerotic cardiovascular disease (ASCVD).* In intermediate-risk (10-year ASCVD risk >=7.5% to <20%) adults or selected borderline-risk (10-year ASCVD risk >=5% to <7.5%) adults in whom a CAC score is measured for the purpose of making a treatment decision the following recommendations have been made:  If CAC = 0, it is reasonable to withhold statin therapy and reassess in 5 to 10 years, as long as higher risk conditions are absent (diabetes mellitus, family history of premature CHD in first degree relatives (males <55 years; females <65 years), cigarette smoking, LDL >=190 mg/dL or other independent risk factors).  If CAC is 1 to 99, it is reasonable to initiate statin therapy for patients >=77 years of age.  If CAC is >=100 or >=75th percentile, it is reasonable to initiate statin therapy at any age.  Cardiology referral should be considered for patients with CAC scores =400 or >=75th percentile.  *2018 AHA/ACC/AACVPR/AAPA/ABC/ACPM/ADA/AGS/APhA/ASPC/NLA/PCNA Guideline on the Management of Blood Cholesterol: A Report of the American College of Cardiology/American Heart Association Task Force on Clinical Practice Guidelines. J Am Coll  Cardiol. 2019;73(24):3168-3209.  Stanly Leavens, MD  Electronically Signed: By: Stanly Leavens M.D. On: 03/01/2023 15:39   CT SCANS  CT CORONARY MORPH W/CTA COR W/SCORE 11/03/2017  Addendum 11/03/2017  1:56 PM ADDENDUM REPORT: 11/03/2017 13:53  CLINICAL DATA:  Chest pain  EXAM: Cardiac CTA  MEDICATIONS: Sub lingual nitro. 4mg  x 2  TECHNIQUE: The patient was scanned on a Siemens 192 slice scanner. Gantry rotation speed was 250 msecs. Collimation was 0.6 mm. A 100 kV prospective scan was triggered in the ascending thoracic aorta at 35-75% of the R-R interval. Average HR during the scan was 60 bpm. The 3D data set was interpreted on a dedicated work station using MPR, MIP and VRT modes. A total of 80cc of contrast was used.  FINDINGS: Non-cardiac: See separate report from [facility].  The pulmonary veins drain normally to the left atrium.  Calcium  Score: 10 Agatston units.  Coronary Arteries: Left dominant with no anomalies  LM: No plaque or stenosis.  LAD system: Calcified plaque without significant stenosis in the proximal LAD, no other atherosclerotic disease seen in the LAD. There is a relatively long segment of bridging myocardium in the proximal-mid LAD.  Circumflex system: Dominant vessel providing left PDA. No plaque or stenosis.  RCA system: Small, nondominant RCA with no significant disease.  IMPRESSION: 1. Coronary artery calcium  score 10 Agatston units. This places the patient in the 57th percentile for age and gender, suggesting intermediate risk for future cardiac events.  2. No obstructive coronary disease. There is a segment of LAD myocardial bridging involving the proximal-mid LAD.  Dalton Mclean   Electronically Signed By: Ezra Shuck M.D. On: 11/03/2017 13:53  Narrative EXAM: OVER-READ INTERPRETATION  CT CHEST  The following report is an over-read performed by radiologist Dr. Franky Crease of [facility] on 11/03/2017. This over-read does not include interpretation of cardiac or coronary anatomy or pathology. The coronary CTA interpretation by the cardiologist is attached.  COMPARISON:  None.  FINDINGS: Vascular: Heart is upper limits normal in size, accentuated by pectus deformity of the sternum. Visualized aorta is normal caliber.  Mediastinum/Nodes: No adenopathy in the lower mediastinum or hila.  Lungs/Pleura: Lingular scarring. No other confluent airspace opacities or effusions.  Upper Abdomen: Imaging into the upper abdomen shows no acute findings.  Musculoskeletal: Chest wall soft tissues are unremarkable. Pectus deformity of the sternum. No acute bony abnormality.  IMPRESSION: No acute or significant extracardiac abnormality.  Electronically Signed: By: Franky Crease M.D. On: 11/03/2017 12:19     ______________________________________________________________________________________________          Recent Labs: 02/17/2023: ALT 19; BUN 21; Creatinine, Ser 0.95; Hemoglobin 15.1; Platelets 308; Potassium 3.3; Sodium 141  Recent Lipid Panel    Component Value Date/Time   CHOL 155 07/30/2017 0433   TRIG 60 07/30/2017 0433   HDL 49 07/30/2017 0433   CHOLHDL 3.2 07/30/2017 0433   VLDL 12 07/30/2017 0433   LDLCALC 94 07/30/2017 0433    Physical Exam:    VS:  BP 118/66   Pulse 75   Ht 5' 4 (1.626 m)   Wt 182 lb 12.8 oz (82.9 kg)   SpO2 97%   BMI 31.38 kg/m     Wt Readings from Last 3 Encounters:  09/22/23 182 lb 12.8 oz (82.9 kg)  02/17/23 171 lb 12.8 oz (77.9 kg)  02/22/22 176 lb (79.8 kg)     GEN: She has no xanthoma xanthelasma well nourished, well developed in no acute distress HEENT: Normal NECK: No JVD; No carotid bruits LYMPHATICS: No lymphadenopathy CARDIAC: RRR, no murmurs, rubs, gallops RESPIRATORY:  Clear to auscultation without rales, wheezing or rhonchi  ABDOMEN: Soft, non-tender, non-distended MUSCULOSKELETAL:  No  edema; No deformity  SKIN: Warm and dry NEUROLOGIC:  Alert and oriented x 3 PSYCHIATRIC:  Normal affect    Signed, Redell Leiter, MD  09/22/2023 3:42 PM    [facility]

## 2023-09-22 ENCOUNTER — Ambulatory Visit: Attending: Cardiology | Admitting: Cardiology

## 2023-09-22 ENCOUNTER — Encounter: Payer: Self-pay | Admitting: Cardiology

## 2023-09-22 VITALS — BP 118/66 | HR 75 | Ht 64.0 in | Wt 182.8 lb

## 2023-09-22 DIAGNOSIS — Q245 Malformation of coronary vessels: Secondary | ICD-10-CM

## 2023-09-22 DIAGNOSIS — I2510 Atherosclerotic heart disease of native coronary artery without angina pectoris: Secondary | ICD-10-CM | POA: Diagnosis not present

## 2023-09-22 DIAGNOSIS — I10 Essential (primary) hypertension: Secondary | ICD-10-CM

## 2023-09-22 DIAGNOSIS — E782 Mixed hyperlipidemia: Secondary | ICD-10-CM | POA: Diagnosis not present

## 2023-09-22 DIAGNOSIS — R931 Abnormal findings on diagnostic imaging of heart and coronary circulation: Secondary | ICD-10-CM

## 2023-09-22 NOTE — Patient Instructions (Signed)

## 2023-12-09 DIAGNOSIS — G4733 Obstructive sleep apnea (adult) (pediatric): Secondary | ICD-10-CM | POA: Diagnosis not present

## 2023-12-12 DIAGNOSIS — Z136 Encounter for screening for cardiovascular disorders: Secondary | ICD-10-CM | POA: Diagnosis not present

## 2023-12-12 DIAGNOSIS — Z1389 Encounter for screening for other disorder: Secondary | ICD-10-CM | POA: Diagnosis not present

## 2023-12-12 DIAGNOSIS — Z23 Encounter for immunization: Secondary | ICD-10-CM | POA: Diagnosis not present

## 2023-12-12 DIAGNOSIS — Z139 Encounter for screening, unspecified: Secondary | ICD-10-CM | POA: Diagnosis not present

## 2023-12-12 DIAGNOSIS — Z1339 Encounter for screening examination for other mental health and behavioral disorders: Secondary | ICD-10-CM | POA: Diagnosis not present

## 2023-12-12 DIAGNOSIS — Z1331 Encounter for screening for depression: Secondary | ICD-10-CM | POA: Diagnosis not present

## 2023-12-12 DIAGNOSIS — Z0000 Encounter for general adult medical examination without abnormal findings: Secondary | ICD-10-CM | POA: Diagnosis not present

## 2023-12-12 DIAGNOSIS — Z6830 Body mass index (BMI) 30.0-30.9, adult: Secondary | ICD-10-CM | POA: Diagnosis not present

## 2023-12-19 DIAGNOSIS — I129 Hypertensive chronic kidney disease with stage 1 through stage 4 chronic kidney disease, or unspecified chronic kidney disease: Secondary | ICD-10-CM | POA: Diagnosis not present

## 2023-12-19 DIAGNOSIS — E782 Mixed hyperlipidemia: Secondary | ICD-10-CM | POA: Diagnosis not present

## 2024-01-11 ENCOUNTER — Other Ambulatory Visit: Payer: Self-pay | Admitting: Cardiology
